# Patient Record
Sex: Female | Born: 1980 | Race: White | Hispanic: No | Marital: Married | Smoking: Former smoker
Health system: Southern US, Community
[De-identification: ages and names within clinical notes are randomized; demographics above are authoritative.]

## PROBLEM LIST (undated history)

## (undated) ENCOUNTER — Inpatient Hospital Stay (HOSPITAL_COMMUNITY): Payer: Self-pay

## (undated) DIAGNOSIS — IMO0002 Reserved for concepts with insufficient information to code with codable children: Secondary | ICD-10-CM

## (undated) DIAGNOSIS — K219 Gastro-esophageal reflux disease without esophagitis: Secondary | ICD-10-CM

## (undated) DIAGNOSIS — Z87898 Personal history of other specified conditions: Secondary | ICD-10-CM

## (undated) DIAGNOSIS — D069 Carcinoma in situ of cervix, unspecified: Secondary | ICD-10-CM

## (undated) DIAGNOSIS — B009 Herpesviral infection, unspecified: Secondary | ICD-10-CM

## (undated) DIAGNOSIS — Z8619 Personal history of other infectious and parasitic diseases: Secondary | ICD-10-CM

## (undated) DIAGNOSIS — E282 Polycystic ovarian syndrome: Secondary | ICD-10-CM

## (undated) DIAGNOSIS — N871 Moderate cervical dysplasia: Secondary | ICD-10-CM

## (undated) DIAGNOSIS — Z872 Personal history of diseases of the skin and subcutaneous tissue: Secondary | ICD-10-CM

## (undated) DIAGNOSIS — R51 Headache: Secondary | ICD-10-CM

## (undated) DIAGNOSIS — N926 Irregular menstruation, unspecified: Secondary | ICD-10-CM

## (undated) DIAGNOSIS — I10 Essential (primary) hypertension: Secondary | ICD-10-CM

## (undated) DIAGNOSIS — B379 Candidiasis, unspecified: Secondary | ICD-10-CM

## (undated) DIAGNOSIS — J45909 Unspecified asthma, uncomplicated: Secondary | ICD-10-CM

## (undated) DIAGNOSIS — Z87440 Personal history of urinary (tract) infections: Secondary | ICD-10-CM

## (undated) DIAGNOSIS — R87619 Unspecified abnormal cytological findings in specimens from cervix uteri: Secondary | ICD-10-CM

## (undated) HISTORY — DX: Personal history of urinary (tract) infections: Z87.440

## (undated) HISTORY — DX: Candidiasis, unspecified: B37.9

## (undated) HISTORY — DX: Polycystic ovarian syndrome: E28.2

## (undated) HISTORY — DX: Reserved for concepts with insufficient information to code with codable children: IMO0002

## (undated) HISTORY — DX: Irregular menstruation, unspecified: N92.6

## (undated) HISTORY — DX: Carcinoma in situ of cervix, unspecified: D06.9

## (undated) HISTORY — PX: NO PAST SURGERIES: SHX2092

## (undated) HISTORY — DX: Unspecified asthma, uncomplicated: J45.909

## (undated) HISTORY — DX: Personal history of other specified conditions: Z87.898

## (undated) HISTORY — DX: Personal history of other infectious and parasitic diseases: Z86.19

## (undated) HISTORY — DX: Moderate cervical dysplasia: N87.1

## (undated) HISTORY — DX: Personal history of diseases of the skin and subcutaneous tissue: Z87.2

## (undated) HISTORY — DX: Herpesviral infection, unspecified: B00.9

## (undated) HISTORY — DX: Unspecified abnormal cytological findings in specimens from cervix uteri: R87.619

## (undated) HISTORY — DX: Headache: R51

---

## 1998-09-15 ENCOUNTER — Other Ambulatory Visit: Admission: RE | Admit: 1998-09-15 | Discharge: 1998-09-15 | Payer: Self-pay | Admitting: Obstetrics and Gynecology

## 1999-11-10 ENCOUNTER — Other Ambulatory Visit: Admission: RE | Admit: 1999-11-10 | Discharge: 1999-11-10 | Payer: Self-pay | Admitting: *Deleted

## 2000-09-16 ENCOUNTER — Emergency Department (HOSPITAL_COMMUNITY): Admission: EM | Admit: 2000-09-16 | Discharge: 2000-09-16 | Payer: Self-pay | Admitting: Emergency Medicine

## 2001-05-29 ENCOUNTER — Emergency Department (HOSPITAL_COMMUNITY): Admission: EM | Admit: 2001-05-29 | Discharge: 2001-05-30 | Payer: Self-pay | Admitting: Internal Medicine

## 2002-04-29 DIAGNOSIS — B009 Herpesviral infection, unspecified: Secondary | ICD-10-CM

## 2002-04-29 HISTORY — DX: Herpesviral infection, unspecified: B00.9

## 2002-11-25 ENCOUNTER — Other Ambulatory Visit: Admission: RE | Admit: 2002-11-25 | Discharge: 2002-11-25 | Payer: Self-pay | Admitting: Obstetrics and Gynecology

## 2002-12-30 ENCOUNTER — Other Ambulatory Visit: Admission: RE | Admit: 2002-12-30 | Discharge: 2002-12-30 | Payer: Self-pay | Admitting: Obstetrics and Gynecology

## 2003-01-14 DIAGNOSIS — D069 Carcinoma in situ of cervix, unspecified: Secondary | ICD-10-CM

## 2003-01-14 DIAGNOSIS — N871 Moderate cervical dysplasia: Secondary | ICD-10-CM

## 2003-01-14 HISTORY — DX: Moderate cervical dysplasia: N87.1

## 2003-01-14 HISTORY — DX: Carcinoma in situ of cervix, unspecified: D06.9

## 2003-09-28 ENCOUNTER — Other Ambulatory Visit: Admission: RE | Admit: 2003-09-28 | Discharge: 2003-09-28 | Payer: Self-pay | Admitting: Obstetrics and Gynecology

## 2005-11-09 ENCOUNTER — Other Ambulatory Visit: Admission: RE | Admit: 2005-11-09 | Discharge: 2005-11-09 | Payer: Self-pay | Admitting: Obstetrics and Gynecology

## 2008-09-29 ENCOUNTER — Emergency Department (HOSPITAL_COMMUNITY): Admission: EM | Admit: 2008-09-29 | Discharge: 2008-09-29 | Payer: Self-pay | Admitting: Emergency Medicine

## 2012-01-30 ENCOUNTER — Encounter: Payer: Self-pay | Admitting: Obstetrics and Gynecology

## 2012-01-30 ENCOUNTER — Ambulatory Visit (INDEPENDENT_AMBULATORY_CARE_PROVIDER_SITE_OTHER): Payer: 59 | Admitting: Obstetrics and Gynecology

## 2012-01-30 VITALS — BP 102/64 | HR 68 | Resp 16 | Ht 60.0 in | Wt 160.0 lb

## 2012-01-30 DIAGNOSIS — Z139 Encounter for screening, unspecified: Secondary | ICD-10-CM

## 2012-01-30 DIAGNOSIS — N926 Irregular menstruation, unspecified: Secondary | ICD-10-CM

## 2012-01-30 DIAGNOSIS — Z01419 Encounter for gynecological examination (general) (routine) without abnormal findings: Secondary | ICD-10-CM

## 2012-01-30 DIAGNOSIS — Z124 Encounter for screening for malignant neoplasm of cervix: Secondary | ICD-10-CM

## 2012-01-30 DIAGNOSIS — N979 Female infertility, unspecified: Secondary | ICD-10-CM | POA: Insufficient documentation

## 2012-01-30 NOTE — Progress Notes (Signed)
Contraception none Last pap 11/09/2010 wnl Last Mammo none Last Colonoscopy none Last Dexa Scan none Primary MD Tammy Spear Abuse at Home none  Wants to conceive.  Has been having unprotected IC x 4yrs and no conception.  Also c/o lower abd discomfort last couple mths.  Reports irregular bleeding for awhile now.  Filed Vitals:   01/30/12 1455  BP: 102/64  Pulse: 68  Resp: 16   ROS: noncontributory  Physical Examination: General appearance - alert, well appearing, and in no distress Neck - supple, no significant adenopathy Chest - clear to auscultation, no wheezes, rales or rhonchi, symmetric air entry Heart - normal rate and regular rhythm Abdomen - soft, nontender, nondistended, no masses or organomegaly Breasts - breasts appear normal, no suspicious masses, no skin or nipple changes or axillary nodes Pelvic - normal external genitalia, vulva, vagina, cervix, uterus and adnexa, small amt of blood Back exam - no CVAT Extremities - no edema, redness or tenderness in the calves or thighs  A/P  RTO 4wks to f/u fertility Labs today Pap today Future labs d21 prog and fasting glucose

## 2012-01-31 LAB — TESTOSTERONE, FREE, TOTAL, SHBG
Testosterone, Free: 18.4 pg/mL — ABNORMAL HIGH (ref 0.6–6.8)
Testosterone-% Free: 1.9 % (ref 0.4–2.4)
Testosterone: 95.24 ng/dL — ABNORMAL HIGH (ref 10–70)

## 2012-01-31 LAB — CBC
HCT: 44 % (ref 36.0–46.0)
Hemoglobin: 14.9 g/dL (ref 12.0–15.0)
MCH: 30.8 pg (ref 26.0–34.0)
RBC: 4.83 MIL/uL (ref 3.87–5.11)

## 2012-01-31 LAB — PAP IG W/ RFLX HPV ASCU

## 2012-01-31 LAB — PROLACTIN: Prolactin: 10.9 ng/mL

## 2012-01-31 LAB — FOLLICLE STIMULATING HORMONE: FSH: 5.8 m[IU]/mL

## 2012-02-13 ENCOUNTER — Telehealth: Payer: Self-pay

## 2012-02-13 NOTE — Telephone Encounter (Signed)
Left message for pt to return call regarding Vit-d protocol and PCO Syndrome. Elizabeth Adams

## 2012-02-20 ENCOUNTER — Telehealth: Payer: Self-pay

## 2012-02-20 NOTE — Telephone Encounter (Signed)
LM for pt to cb re: lab results. Elizabeth Adams A

## 2012-02-26 ENCOUNTER — Telehealth: Payer: Self-pay

## 2012-02-26 NOTE — Telephone Encounter (Signed)
Spoke to pt to let her know that labs suggest PCOS. Pt has follow up appt on 03/03/2012 w/ AR. Will discuss at length then. Pt needs Vit D protocol. Vit D protocol called to [facility] in [place]. Vit D  50,000 units 1x weekly x 12 weeks # 20 No RF. Melody Comas A

## 2012-02-28 ENCOUNTER — Other Ambulatory Visit: Payer: Self-pay

## 2012-02-28 DIAGNOSIS — N979 Female infertility, unspecified: Secondary | ICD-10-CM

## 2012-02-29 ENCOUNTER — Other Ambulatory Visit: Payer: 59

## 2012-02-29 DIAGNOSIS — N979 Female infertility, unspecified: Secondary | ICD-10-CM

## 2012-02-29 LAB — GLUCOSE, FASTING: Glucose, Fasting: 90 mg/dL (ref 70–99)

## 2012-03-03 ENCOUNTER — Ambulatory Visit (INDEPENDENT_AMBULATORY_CARE_PROVIDER_SITE_OTHER): Payer: 59 | Admitting: Obstetrics and Gynecology

## 2012-03-03 ENCOUNTER — Encounter: Payer: Self-pay | Admitting: Obstetrics and Gynecology

## 2012-03-03 VITALS — BP 102/68 | Resp 18 | Ht 60.0 in | Wt 160.0 lb

## 2012-03-03 DIAGNOSIS — N979 Female infertility, unspecified: Secondary | ICD-10-CM

## 2012-03-03 DIAGNOSIS — N926 Irregular menstruation, unspecified: Secondary | ICD-10-CM

## 2012-03-03 MED ORDER — METFORMIN HCL 500 MG PO TABS: 500.0000 mg | ORAL_TABLET | Freq: Every day | ORAL | Status: DC

## 2012-03-03 NOTE — Progress Notes (Signed)
Reviewed labs.  Pt wants to conceive. Labs c/w PCOs and vit D insufficiency Day 21 prog scheduled for Friday RTO 10-12 days for f/u day 21 prog If low, rec femara (or clomid) trial x of ovulation Will also start metformin SE and use reviewed If no success with Femara, rec SA and HSG with femara and IUI and HCG Pt is agreeable

## 2012-03-07 ENCOUNTER — Other Ambulatory Visit: Payer: 59

## 2012-03-07 DIAGNOSIS — N979 Female infertility, unspecified: Secondary | ICD-10-CM

## 2012-03-10 ENCOUNTER — Telehealth: Payer: Self-pay

## 2012-03-10 NOTE — Telephone Encounter (Signed)
Lm for patient to return call regarding follow-up appointment for d21 per Dr. Su Hilt.Mathis Bud

## 2012-03-11 NOTE — Telephone Encounter (Signed)
Pam/return call

## 2012-03-24 ENCOUNTER — Telehealth: Payer: Self-pay | Admitting: Obstetrics and Gynecology

## 2012-03-25 ENCOUNTER — Encounter: Payer: 59 | Admitting: Obstetrics and Gynecology

## 2012-03-28 ENCOUNTER — Telehealth: Payer: Self-pay

## 2012-03-28 NOTE — Telephone Encounter (Signed)
Spoke again to pt to let her know that it would be a good idea to discontinue for a few days if she plans to have some alcoholic beverages, but she should be fine to continue if she doesn't over do it. Pt voices understanding. Melody Comas A

## 2012-03-28 NOTE — Telephone Encounter (Signed)
Per AR, Femara 2.5 mg 1 po q d  Days 3-7 of menses. 0 RF called to pts pharmacy in [place]. Alvino Chapel

## 2012-03-28 NOTE — Telephone Encounter (Signed)
Returned pt's call re 21 Day progesterone test. Femara 2.5 will be called to pt's pharmacy. She will start it next cycle. She also had a ? Re her Metformin and etoh. She is going on vacation in 1-2 weeks and wants to know if she should keep taking Metformin if she plans to have some drinks or should she continue to take and just not drink in excess?

## 2012-05-21 ENCOUNTER — Telehealth: Payer: Self-pay | Admitting: Obstetrics and Gynecology

## 2012-05-21 NOTE — Telephone Encounter (Signed)
Per AR, needs to see pt first.  Last seen in July.   Pt sch for 06-02-12. Pt agreeable.  ld

## 2012-05-21 NOTE — Telephone Encounter (Signed)
VM from pt. Requesting Rx for Clomid. Was to be called for next cycle which is due this week. [facility], [place]. PT [phone] or [phone]

## 2012-06-02 ENCOUNTER — Ambulatory Visit (INDEPENDENT_AMBULATORY_CARE_PROVIDER_SITE_OTHER): Payer: 59 | Admitting: Obstetrics and Gynecology

## 2012-06-02 ENCOUNTER — Encounter: Payer: Self-pay | Admitting: Obstetrics and Gynecology

## 2012-06-02 VITALS — BP 110/70 | Ht 60.0 in | Wt 163.0 lb

## 2012-06-02 DIAGNOSIS — N970 Female infertility associated with anovulation: Secondary | ICD-10-CM

## 2012-06-02 MED ORDER — LETROZOLE 2.5 MG PO TABS: 2.5000 mg | ORAL_TABLET | Freq: Every day | ORAL | Status: DC

## 2012-06-02 NOTE — Progress Notes (Signed)
Here to f/u progesterone for fertility  Filed Vitals:   06/02/12 1548  BP: 110/70   A/P Reviewed plan Pt is anovulatory although cycles have regulated on metformin tid Rx Femara

## 2012-07-16 ENCOUNTER — Other Ambulatory Visit: Payer: 59

## 2012-07-16 DIAGNOSIS — N970 Female infertility associated with anovulation: Secondary | ICD-10-CM

## 2012-07-16 LAB — PROGESTERONE: Progesterone: 5.4 ng/mL

## 2012-07-25 ENCOUNTER — Telehealth: Payer: Self-pay

## 2012-07-25 NOTE — Telephone Encounter (Signed)
LM for pt to cb re: making f/u appt. And to call re recent test results. Elizabeth Adams A

## 2012-07-25 NOTE — Telephone Encounter (Signed)
Spoke to pt to get her scheduled for follow up labs/infertility. Elizabeth Adams A

## 2012-08-06 ENCOUNTER — Ambulatory Visit (INDEPENDENT_AMBULATORY_CARE_PROVIDER_SITE_OTHER): Payer: 59 | Admitting: Obstetrics and Gynecology

## 2012-08-06 ENCOUNTER — Encounter: Payer: Self-pay | Admitting: Obstetrics and Gynecology

## 2012-08-06 VITALS — BP 104/64 | Ht 60.0 in | Wt 164.0 lb

## 2012-08-06 DIAGNOSIS — N970 Female infertility associated with anovulation: Secondary | ICD-10-CM

## 2012-08-06 MED ORDER — LETROZOLE 2.5 MG PO TABS: 5.0000 mg | ORAL_TABLET | Freq: Every day | ORAL | Status: DC

## 2012-08-06 NOTE — Progress Notes (Signed)
Here to f/u femara at 2.5mg  taken during Nov cycle. Pt was anovulatory on 2.5mg  and she cont to take metformin tid  Filed Vitals:   08/06/12 1122  BP: 104/64   [employer] femara to 5mg  Will try again Jan cycle and return for visit after gets blood drawn on day 21 Pt will call to schedule

## 2012-10-07 ENCOUNTER — Telehealth: Payer: Self-pay | Admitting: Obstetrics and Gynecology

## 2012-10-07 NOTE — Telephone Encounter (Signed)
Ar pt 

## 2012-10-08 ENCOUNTER — Telehealth: Payer: Self-pay

## 2012-10-08 ENCOUNTER — Other Ambulatory Visit: Payer: Self-pay | Admitting: Obstetrics and Gynecology

## 2012-10-08 NOTE — Telephone Encounter (Signed)
Spoke to [employer] associate  To RF pt's Metformin 500 mg # 90 to use as previously directed w 1 RF. Per AR/ Alvino Chapel

## 2012-10-08 NOTE — Telephone Encounter (Signed)
Medication RF'd . Enough to last her til 01/2013 AEX. Melody Comas A

## 2012-10-27 ENCOUNTER — Telehealth: Payer: Self-pay | Admitting: Obstetrics and Gynecology

## 2012-10-27 NOTE — Telephone Encounter (Signed)
TC to pt. States LMP 10/12/12.   21 Day Progesterone due 11/02/11.  Pt requests lab be done at [facility]. Order to be faxed. Pt scheduled for F?u with DR Ar 11/14/12.

## 2012-11-01 ENCOUNTER — Other Ambulatory Visit: Payer: Self-pay | Admitting: Obstetrics and Gynecology

## 2013-01-29 LAB — OB RESULTS CONSOLE GBS: GBS: POSITIVE

## 2013-07-10 LAB — OB RESULTS CONSOLE GC/CHLAMYDIA
Chlamydia: NEGATIVE
Gonorrhea: NEGATIVE

## 2013-07-10 LAB — OB RESULTS CONSOLE HEPATITIS B SURFACE ANTIGEN: HEP B S AG: NEGATIVE

## 2013-07-10 LAB — OB RESULTS CONSOLE RUBELLA ANTIBODY, IGM: Rubella: IMMUNE

## 2013-07-10 LAB — OB RESULTS CONSOLE RPR: RPR: NONREACTIVE

## 2013-07-10 LAB — OB RESULTS CONSOLE HIV ANTIBODY (ROUTINE TESTING): HIV: NONREACTIVE

## 2013-08-10 ENCOUNTER — Inpatient Hospital Stay (HOSPITAL_COMMUNITY)
Admission: AD | Admit: 2013-08-10 | Discharge: 2013-08-10 | Disposition: A | Discharge status: Home / Self Care | Payer: 59 | Source: Ambulatory Visit | Attending: Obstetrics and Gynecology | Admitting: Obstetrics and Gynecology

## 2013-08-10 ENCOUNTER — Encounter (HOSPITAL_COMMUNITY): Payer: Self-pay | Admitting: *Deleted

## 2013-08-10 DIAGNOSIS — O4700 False labor before 37 completed weeks of gestation, unspecified trimester: Secondary | ICD-10-CM | POA: Insufficient documentation

## 2013-08-10 DIAGNOSIS — O30009 Twin pregnancy, unspecified number of placenta and unspecified number of amniotic sacs, unspecified trimester: Secondary | ICD-10-CM | POA: Insufficient documentation

## 2013-08-10 LAB — URINALYSIS, ROUTINE W REFLEX MICROSCOPIC
Bilirubin Urine: NEGATIVE
Hgb urine dipstick: NEGATIVE
Specific Gravity, Urine: <1.005 — ABNORMAL LOW (ref 1.005–1.030)
pH: 7 (ref 5.0–8.0)

## 2013-08-10 LAB — WET PREP, GENITAL
Clue Cells Wet Prep HPF POC: NONE SEEN
Trich, Wet Prep: NONE SEEN
Yeast Wet Prep HPF POC: NONE SEEN

## 2013-08-10 LAB — URINE MICROSCOPIC-ADD ON

## 2013-08-10 NOTE — MAU Note (Signed)
Pt presents with complaints of having some abdominal cramping over the last couple of weeks but has gotten worse over the weekend and noticed sharp pains at the lower part of her abdomen. Denies any bleeding but has noticed some yellowish discharge over the last couple of days.

## 2013-08-10 NOTE — MAU Provider Note (Signed)
DATE: 08/10/2013  Maternity Admissions Unit History and Physical Exam for an Obstetrics Patient  Elizabeth Adams is a 32 y.o. female, G1P0, at [redacted]w[redacted]d gestation, who presents for contractions and a yellow vaginal discharge. She has been followed at the [facility].  Her pregnancy has been complicated by twin gestation. The patient denies vaginal odor. She denies leakage of fluid and bleeding. She denies headaches, blurred vision, and right upper quadrant tenderness. See history below.  OB History   Grav Para Term Preterm Abortions TAB SAB Ect Mult Living   1               Past Medical History  Diagnosis Date  . PCOS (polycystic ovarian syndrome)   . H/O varicella   . Asthma   . Headache(784.0)     Frequent  . H/O bacterial infection   . Yeast infection   . 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 Menses, irregular   . Hx: UTI (urinary tract infection)   . CIN II (cervical intraepithelial neoplasia II) 01/14/03  . CIN III (cervical intraepithelial neoplasia grade III) with severe dysplasia 01/14/03  . Abnormal Pap smear 2000, 2004 & 2005  . H/O female hirsutism      History reviewed. No pertinent past surgical history.  Allergies  Allergen Reactions  . Doxycycline Shortness Of Breath    Family History: family history includes Hypertension in her father; Thyroid disease in her mother.  Social History:  reports that she quit smoking about 18 months ago. Her smoking use included Cigarettes. She smoked 0.00 packs per day. She does not have any smokeless tobacco history on file. She reports that she drinks alcohol. She reports that she does not use illicit drugs.  Review of systems: Normal pregnancy complaints.  Admission Physical Exam:  Dilation: Fingertip Effacement (%): 30 Exam by:: Vickie Ponds MD Body mass index is 39.61 kg/(m^2).  Blood pressure 144/86, pulse 92, temperature 98.1 F (36.7 C), temperature source  Oral, resp. rate 16, height 5' (1.524 m), weight 202 lb 12.8 oz (91.989 kg), last menstrual period 12/16/2012.  HEENT:                 Within normal limits Chest:                   Clear Heart:                    Regular rate and rhythm Abdomen:             Gravid and nontender Extremities:          Grossly normal. 2+ edema. Neurologic exam: Grossly normal. No clonus. Pelvic exam:         Cervix: ftp/20%/-4   NST: Category 1 x 2; Contractions: Irritability .   Results for orders placed during the hospital encounter of 08/10/13 (from the past 24 hour(s))  URINALYSIS, ROUTINE W REFLEX MICROSCOPIC     Status: Abnormal   Collection Time    08/10/13 11:54 AM      Result Value Range   Color, Urine YELLOW  YELLOW   APPearance HAZY (*) CLEAR   Specific Gravity, Urine <1.005 (*) 1.005 - 1.030   pH 7.0  5.0 - 8.0   Glucose, UA NEGATIVE  NEGATIVE mg/dL   Hgb urine dipstick NEGATIVE  NEGATIVE   Bilirubin Urine NEGATIVE  NEGATIVE   Ketones, ur NEGATIVE  NEGATIVE mg/dL   Protein, ur  NEGATIVE  NEGATIVE mg/dL   Urobilinogen, UA 0.2  0.0 - 1.0 mg/dL   Nitrite NEGATIVE  NEGATIVE   Leukocytes, UA MODERATE (*) NEGATIVE  URINE MICROSCOPIC-ADD ON     Status: Abnormal   Collection Time    08/10/13 11:54 AM      Result Value Range   Squamous Epithelial / LPF FEW (*) RARE   WBC, UA 0-2  <3 WBC/hpf   Bacteria, UA FEW (*) RARE     Assessment:  [redacted]w[redacted]d gestation  Twin gestation  Uterine irritability  Vaginal discharge  No labor  Plan:  Wet prep was sent. I will call the patient with the results.  PIH discussed.  Return to office in one week for repeat examination.   Melesa Lecy V 08/10/2013, 1:08 PM

## 2013-09-02 ENCOUNTER — Encounter (HOSPITAL_COMMUNITY): Payer: Self-pay | Admitting: *Deleted

## 2013-09-02 ENCOUNTER — Inpatient Hospital Stay (HOSPITAL_COMMUNITY)
Admission: AD | Admit: 2013-09-02 | Discharge: 2013-09-06 | DRG: 765 | Disposition: A | Discharge status: Home / Self Care | Payer: 59 | Source: Ambulatory Visit | Attending: Obstetrics and Gynecology | Admitting: Obstetrics and Gynecology

## 2013-09-02 DIAGNOSIS — O139 Gestational [pregnancy-induced] hypertension without significant proteinuria, unspecified trimester: Principal | ICD-10-CM | POA: Diagnosis present

## 2013-09-02 DIAGNOSIS — O99892 Other specified diseases and conditions complicating childbirth: Secondary | ICD-10-CM | POA: Diagnosis present

## 2013-09-02 DIAGNOSIS — O9989 Other specified diseases and conditions complicating pregnancy, childbirth and the puerperium: Secondary | ICD-10-CM

## 2013-09-02 DIAGNOSIS — O30009 Twin pregnancy, unspecified number of placenta and unspecified number of amniotic sacs, unspecified trimester: Secondary | ICD-10-CM | POA: Diagnosis present

## 2013-09-02 DIAGNOSIS — O30049 Twin pregnancy, dichorionic/diamniotic, unspecified trimester: Secondary | ICD-10-CM

## 2013-09-02 DIAGNOSIS — Z22330 Carrier of Group B streptococcus: Secondary | ICD-10-CM

## 2013-09-02 NOTE — Progress Notes (Signed)
Non-pitting edema noted bilaterally in lower extremeties

## 2013-09-02 NOTE — MAU Note (Signed)
Pt seen in the office today 3/100%, now having stronger ctx. Twin pregnancy, GBS +

## 2013-09-03 ENCOUNTER — Encounter (HOSPITAL_COMMUNITY): Payer: Self-pay | Admitting: Anesthesiology

## 2013-09-03 ENCOUNTER — Encounter (HOSPITAL_COMMUNITY): Payer: 59 | Admitting: Anesthesiology

## 2013-09-03 ENCOUNTER — Inpatient Hospital Stay (HOSPITAL_COMMUNITY): Payer: 59

## 2013-09-03 ENCOUNTER — Encounter (HOSPITAL_COMMUNITY)
Admission: AD | Disposition: A | Discharge status: Home / Self Care | Payer: Self-pay | Source: Ambulatory Visit | Attending: Obstetrics and Gynecology

## 2013-09-03 ENCOUNTER — Inpatient Hospital Stay (HOSPITAL_COMMUNITY): Payer: 59 | Admitting: Anesthesiology

## 2013-09-03 ENCOUNTER — Encounter (HOSPITAL_COMMUNITY): Payer: Self-pay | Admitting: Family Medicine

## 2013-09-03 DIAGNOSIS — O139 Gestational [pregnancy-induced] hypertension without significant proteinuria, unspecified trimester: Secondary | ICD-10-CM | POA: Diagnosis present

## 2013-09-03 DIAGNOSIS — O479 False labor, unspecified: Secondary | ICD-10-CM

## 2013-09-03 DIAGNOSIS — O30049 Twin pregnancy, dichorionic/diamniotic, unspecified trimester: Secondary | ICD-10-CM | POA: Diagnosis present

## 2013-09-03 LAB — COMPREHENSIVE METABOLIC PANEL
ALBUMIN: 2.6 g/dL — AB (ref 3.5–5.2)
ALK PHOS: 245 U/L — AB (ref 39–117)
ALT: 11 U/L (ref 0–35)
AST: 33 U/L (ref 0–37)
BILIRUBIN TOTAL: 0.2 mg/dL — AB (ref 0.3–1.2)
BUN: 9 mg/dL (ref 6–23)
CHLORIDE: 102 meq/L (ref 96–112)
CO2: 22 mEq/L (ref 19–32)
Calcium: 10.4 mg/dL (ref 8.4–10.5)
Creatinine, Ser: 0.93 mg/dL (ref 0.50–1.10)
GFR calc non Af Amer: 79 mL/min — ABNORMAL LOW (ref >90)
GLUCOSE: 75 mg/dL (ref 70–99)
POTASSIUM: 3.8 meq/L (ref 3.7–5.3)
SODIUM: 138 meq/L (ref 137–147)
TOTAL PROTEIN: 5.9 g/dL — AB (ref 6.0–8.3)

## 2013-09-03 LAB — CBC
HCT: 39.8 % (ref 36.0–46.0)
HCT: 37 % (ref 36.0–46.0)
HEMATOCRIT: 39.9 % (ref 36.0–46.0)
HEMOGLOBIN: 12.7 g/dL (ref 12.0–15.0)
HEMOGLOBIN: 13.8 g/dL (ref 12.0–15.0)
Hemoglobin: 13.5 g/dL (ref 12.0–15.0)
MCH: 30.7 pg (ref 26.0–34.0)
MCH: 31 pg (ref 26.0–34.0)
MCH: 31.2 pg (ref 26.0–34.0)
MCHC: 33.9 g/dL (ref 30.0–36.0)
MCHC: 34.3 g/dL (ref 30.0–36.0)
MCHC: 34.6 g/dL (ref 30.0–36.0)
MCV: 90.1 fL (ref 78.0–100.0)
MCV: 90.2 fL (ref 78.0–100.0)
MCV: 90.5 fL (ref 78.0–100.0)
PLATELETS: 220 10*3/uL (ref 150–400)
Platelets: 194 10*3/uL (ref 150–400)
Platelets: 219 10*3/uL (ref 150–400)
RBC: 4.1 MIL/uL (ref 3.87–5.11)
RBC: 4.4 MIL/uL (ref 3.87–5.11)
RBC: 4.43 MIL/uL (ref 3.87–5.11)
RDW: 14.3 % (ref 11.5–15.5)
RDW: 14.6 % (ref 11.5–15.5)
RDW: 14.9 % (ref 11.5–15.5)
WBC: 13.1 10*3/uL — ABNORMAL HIGH (ref 4.0–10.5)
WBC: 11.4 10*3/uL — ABNORMAL HIGH (ref 4.0–10.5)
WBC: 12.3 10*3/uL — AB (ref 4.0–10.5)

## 2013-09-03 LAB — PROTEIN / CREATININE RATIO, URINE
CREATININE, URINE: 55.11 mg/dL
Protein Creatinine Ratio: 0.26 — ABNORMAL HIGH (ref 0.00–0.15)
Total Protein, Urine: 14.3 mg/dL

## 2013-09-03 LAB — URINALYSIS, ROUTINE W REFLEX MICROSCOPIC
Bilirubin Urine: NEGATIVE
GLUCOSE, UA: NEGATIVE mg/dL
Ketones, ur: NEGATIVE mg/dL
NITRITE: NEGATIVE
PH: 6.5 (ref 5.0–8.0)
Protein, ur: NEGATIVE mg/dL
Urobilinogen, UA: 0.2 mg/dL (ref 0.0–1.0)

## 2013-09-03 LAB — ABO/RH: ABO/RH(D): B POS

## 2013-09-03 LAB — RPR: RPR Ser Ql: NONREACTIVE

## 2013-09-03 LAB — URINE MICROSCOPIC-ADD ON

## 2013-09-03 LAB — PREPARE RBC (CROSSMATCH)

## 2013-09-03 SURGERY — Surgical Case
Anesthesia: Spinal | Site: Abdomen

## 2013-09-03 MED ORDER — MORPHINE SULFATE (PF) 0.5 MG/ML IJ SOLN
INTRAMUSCULAR | Status: DC | PRN
  Administered 2013-09-03: .1 mg via INTRATHECAL

## 2013-09-03 MED ORDER — EPHEDRINE 5 MG/ML INJ: 10.0000 mg | INTRAVENOUS | Status: DC | PRN

## 2013-09-03 MED ORDER — FENTANYL CITRATE 0.05 MG/ML IJ SOLN
INTRAMUSCULAR | Status: DC | PRN
  Administered 2013-09-03: 15 ug via INTRATHECAL

## 2013-09-03 MED ORDER — OXYCODONE-ACETAMINOPHEN 5-325 MG PO TABS: 1.0000 | ORAL_TABLET | ORAL | Status: DC | PRN

## 2013-09-03 MED ORDER — PHENYLEPHRINE 40 MCG/ML (10ML) SYRINGE FOR IV PUSH (FOR BLOOD PRESSURE SUPPORT): 80.0000 ug | PREFILLED_SYRINGE | INTRAVENOUS | Status: DC | PRN

## 2013-09-03 MED ORDER — MEPERIDINE HCL 25 MG/ML IJ SOLN
INTRAMUSCULAR | Status: AC
  Filled 2013-09-03: qty 1

## 2013-09-03 MED ORDER — FENTANYL CITRATE 0.05 MG/ML IJ SOLN
INTRAMUSCULAR | Status: DC | PRN
  Administered 2013-09-03: 87.5 ug via INTRAVENOUS

## 2013-09-03 MED ORDER — KETOROLAC TROMETHAMINE 60 MG/2ML IM SOLN
INTRAMUSCULAR | Status: AC
  Filled 2013-09-03: qty 2

## 2013-09-03 MED ORDER — OXYTOCIN BOLUS FROM INFUSION: 500.0000 mL | INTRAVENOUS | Status: DC

## 2013-09-03 MED ORDER — CEFAZOLIN SODIUM-DEXTROSE 2-3 GM-% IV SOLR
2.0000 g | Freq: Once | INTRAVENOUS | Status: DC
  Filled 2013-09-03: qty 50

## 2013-09-03 MED ORDER — IBUPROFEN 600 MG PO TABS: 600.0000 mg | ORAL_TABLET | Freq: Four times a day (QID) | ORAL | Status: DC | PRN

## 2013-09-03 MED ORDER — BUPIVACAINE IN DEXTROSE 0.75-8.25 % IT SOLN
INTRATHECAL | Status: DC | PRN
  Administered 2013-09-03: 1.2 mg via INTRATHECAL

## 2013-09-03 MED ORDER — FENTANYL CITRATE 0.05 MG/ML IJ SOLN
INTRAMUSCULAR | Status: AC
  Filled 2013-09-03: qty 2

## 2013-09-03 MED ORDER — FAMOTIDINE 20 MG PO TABS
10.0000 mg | ORAL_TABLET | Freq: Two times a day (BID) | ORAL | Status: DC
  Administered 2013-09-03: 10 mg via ORAL
  Filled 2013-09-03: qty 1

## 2013-09-03 MED ORDER — FENTANYL CITRATE 0.05 MG/ML IJ SOLN
25.0000 ug | INTRAMUSCULAR | Status: DC | PRN
  Administered 2013-09-04: 50 ug via INTRAVENOUS

## 2013-09-03 MED ORDER — MEPERIDINE HCL 25 MG/ML IJ SOLN: 6.2500 mg | INTRAMUSCULAR | Status: DC | PRN

## 2013-09-03 MED ORDER — METOCLOPRAMIDE HCL 5 MG/ML IJ SOLN: 10.0000 mg | Freq: Once | INTRAMUSCULAR | Status: AC | PRN

## 2013-09-03 MED ORDER — MORPHINE SULFATE (PF) 0.5 MG/ML IJ SOLN
INTRAMUSCULAR | Status: DC | PRN
  Administered 2013-09-03: 4.9 mg via INTRAVENOUS

## 2013-09-03 MED ORDER — LACTATED RINGERS IV SOLN
INTRAVENOUS | Status: DC | PRN
  Administered 2013-09-03: 22:00:00 via INTRAVENOUS

## 2013-09-03 MED ORDER — PHENYLEPHRINE HCL 10 MG/ML IJ SOLN: INTRAMUSCULAR | Status: DC | PRN

## 2013-09-03 MED ORDER — MORPHINE SULFATE 0.5 MG/ML IJ SOLN
INTRAMUSCULAR | Status: AC
  Filled 2013-09-03: qty 10

## 2013-09-03 MED ORDER — FENTANYL 2.5 MCG/ML BUPIVACAINE 1/10 % EPIDURAL INFUSION (WH - ANES): 14.0000 mL/h | INTRAMUSCULAR | Status: DC | PRN

## 2013-09-03 MED ORDER — OXYTOCIN 40 UNITS IN LACTATED RINGERS INFUSION - SIMPLE MED
1.0000 m[IU]/min | INTRAVENOUS | Status: DC
  Administered 2013-09-03: 2 m[IU]/min via INTRAVENOUS
  Filled 2013-09-03: qty 1000

## 2013-09-03 MED ORDER — KETOROLAC TROMETHAMINE 60 MG/2ML IM SOLN
60.0000 mg | Freq: Once | INTRAMUSCULAR | Status: AC | PRN
  Administered 2013-09-03: 60 mg via INTRAMUSCULAR

## 2013-09-03 MED ORDER — ONDANSETRON HCL 4 MG/2ML IJ SOLN
INTRAMUSCULAR | Status: DC | PRN
  Administered 2013-09-03: 4 mg via INTRAVENOUS

## 2013-09-03 MED ORDER — LACTATED RINGERS IV SOLN: 500.0000 mL | INTRAVENOUS | Status: DC | PRN

## 2013-09-03 MED ORDER — SCOPOLAMINE 1 MG/3DAYS TD PT72
MEDICATED_PATCH | TRANSDERMAL | Status: AC
  Filled 2013-09-03: qty 1

## 2013-09-03 MED ORDER — LACTATED RINGERS IV SOLN
INTRAVENOUS | Status: DC
  Administered 2013-09-03 (×4): via INTRAVENOUS

## 2013-09-03 MED ORDER — DIPHENHYDRAMINE HCL 50 MG/ML IJ SOLN: 12.5000 mg | INTRAMUSCULAR | Status: DC | PRN

## 2013-09-03 MED ORDER — ACETAMINOPHEN 325 MG PO TABS: 650.0000 mg | ORAL_TABLET | ORAL | Status: DC | PRN

## 2013-09-03 MED ORDER — DEXTROSE 5 % IV SOLN
2.5000 10*6.[IU] | INTRAVENOUS | Status: DC
  Administered 2013-09-03 (×3): 2.5 10*6.[IU] via INTRAVENOUS
  Filled 2013-09-03 (×6): qty 2.5

## 2013-09-03 MED ORDER — ONDANSETRON HCL 4 MG/2ML IJ SOLN: 4.0000 mg | Freq: Four times a day (QID) | INTRAMUSCULAR | Status: DC | PRN

## 2013-09-03 MED ORDER — LABETALOL HCL 5 MG/ML IV SOLN
10.0000 mg | INTRAVENOUS | Status: DC | PRN
Start: 2013-09-03 — End: 2013-09-04
  Filled 2013-09-03: qty 8

## 2013-09-03 MED ORDER — VALACYCLOVIR HCL 500 MG PO TABS
500.0000 mg | ORAL_TABLET | Freq: Every day | ORAL | Status: DC
  Filled 2013-09-03 (×2): qty 1

## 2013-09-03 MED ORDER — LIDOCAINE HCL (PF) 1 % IJ SOLN: 30.0000 mL | INTRAMUSCULAR | Status: DC | PRN

## 2013-09-03 MED ORDER — LABETALOL HCL 5 MG/ML IV SOLN
20.0000 mg | Freq: Once | INTRAVENOUS | Status: AC
  Administered 2013-09-03: 20 mg via INTRAVENOUS
  Filled 2013-09-03: qty 4

## 2013-09-03 MED ORDER — SCOPOLAMINE 1 MG/3DAYS TD PT72
1.0000 | MEDICATED_PATCH | Freq: Once | TRANSDERMAL | Status: DC
  Administered 2013-09-03: 1.5 mg via TRANSDERMAL

## 2013-09-03 MED ORDER — PHENYLEPHRINE 8 MG IN D5W 100 ML (0.08MG/ML) PREMIX OPTIME
INJECTION | INTRAVENOUS | Status: DC | PRN
  Administered 2013-09-03: 65 ug/min via INTRAVENOUS

## 2013-09-03 MED ORDER — PHENYLEPHRINE 8 MG IN D5W 100 ML (0.08MG/ML) PREMIX OPTIME
INJECTION | INTRAVENOUS | Status: AC
  Filled 2013-09-03: qty 100

## 2013-09-03 MED ORDER — LACTATED RINGERS IV SOLN
INTRAVENOUS | Status: DC
  Administered 2013-09-03: 20:00:00 via INTRAVENOUS

## 2013-09-03 MED ORDER — CITRIC ACID-SODIUM CITRATE 334-500 MG/5ML PO SOLN
30.0000 mL | ORAL | Status: DC | PRN
Start: 2013-09-03 — End: 2013-09-03
  Administered 2013-09-03 (×2): 30 mL via ORAL
  Filled 2013-09-03 (×2): qty 15

## 2013-09-03 MED ORDER — ONDANSETRON HCL 4 MG/2ML IJ SOLN
INTRAMUSCULAR | Status: AC
  Filled 2013-09-03: qty 2

## 2013-09-03 MED ORDER — OXYTOCIN 10 UNIT/ML IJ SOLN
INTRAMUSCULAR | Status: AC
  Filled 2013-09-03: qty 4

## 2013-09-03 MED ORDER — CEFAZOLIN SODIUM-DEXTROSE 2-3 GM-% IV SOLR
INTRAVENOUS | Status: DC | PRN
  Administered 2013-09-03: 2 g via INTRAVENOUS

## 2013-09-03 MED ORDER — OXYTOCIN 40 UNITS IN LACTATED RINGERS INFUSION - SIMPLE MED: 62.5000 mL/h | INTRAVENOUS | Status: DC

## 2013-09-03 MED ORDER — OXYTOCIN 10 UNIT/ML IJ SOLN
40.0000 [IU] | INTRAVENOUS | Status: DC | PRN
  Administered 2013-09-03: 40 [IU] via INTRAVENOUS

## 2013-09-03 MED ORDER — DEXTROSE 5 % IV SOLN
5.0000 10*6.[IU] | Freq: Once | INTRAVENOUS | Status: AC
  Administered 2013-09-03: 5 10*6.[IU] via INTRAVENOUS
  Filled 2013-09-03: qty 5

## 2013-09-03 MED ORDER — LACTATED RINGERS IV SOLN
500.0000 mL | Freq: Once | INTRAVENOUS | Status: DC
  Administered 2013-09-03: 500 mL via INTRAVENOUS

## 2013-09-03 MED ORDER — MEPERIDINE HCL 25 MG/ML IJ SOLN
INTRAMUSCULAR | Status: DC | PRN
  Administered 2013-09-03 (×2): 12.5 mg via INTRAVENOUS

## 2013-09-03 SURGICAL SUPPLY — 38 items
APL SKNCLS STERI-STRIP NONHPOA (GAUZE/BANDAGES/DRESSINGS) ×1
BENZOIN TINCTURE PRP APPL 2/3 (GAUZE/BANDAGES/DRESSINGS) ×2 IMPLANT
CLAMP CORD UMBIL (MISCELLANEOUS) IMPLANT
CLOTH BEACON ORANGE TIMEOUT ST (SAFETY) ×2 IMPLANT
CONTAINER PREFILL 10% NBF 15ML (MISCELLANEOUS) IMPLANT
DRAIN JACKSON PRT FLT 10 (DRAIN) IMPLANT
DRAPE LG THREE QUARTER DISP (DRAPES) IMPLANT
DRSG OPSITE POSTOP 4X10 (GAUZE/BANDAGES/DRESSINGS) ×2 IMPLANT
DURAPREP 26ML APPLICATOR (WOUND CARE) ×2 IMPLANT
ELECT REM PT RETURN 9FT ADLT (ELECTROSURGICAL) ×2
ELECTRODE REM PT RTRN 9FT ADLT (ELECTROSURGICAL) ×1 IMPLANT
EVACUATOR SILICONE 100CC (DRAIN) IMPLANT
EXTRACTOR VACUUM M CUP 4 TUBE (SUCTIONS) IMPLANT
GLOVE BIO SURGEON STRL SZ 6.5 (GLOVE) ×2 IMPLANT
GLOVE BIOGEL PI IND STRL 7.0 (GLOVE) ×1 IMPLANT
GLOVE BIOGEL PI INDICATOR 7.0 (GLOVE) ×1
GOWN PREVENTION PLUS XLARGE (GOWN DISPOSABLE) ×2 IMPLANT
GOWN STRL REIN XL XLG (GOWN DISPOSABLE) ×2 IMPLANT
KIT ABG SYR 3ML LUER SLIP (SYRINGE) IMPLANT
NDL HYPO 25X5/8 SAFETYGLIDE (NEEDLE) IMPLANT
NEEDLE HYPO 25X5/8 SAFETYGLIDE (NEEDLE) IMPLANT
NS IRRIG 1000ML POUR BTL (IV SOLUTION) ×2 IMPLANT
PACK C SECTION WH (CUSTOM PROCEDURE TRAY) ×2 IMPLANT
PAD OB MATERNITY 4.3X12.25 (PERSONAL CARE ITEMS) ×2 IMPLANT
RTRCTR C-SECT PINK 25CM LRG (MISCELLANEOUS) IMPLANT
STAPLER VISISTAT 35W (STAPLE) IMPLANT
STRIP CLOSURE SKIN 1/2X4 (GAUZE/BANDAGES/DRESSINGS) ×2 IMPLANT
SUT CHROMIC 0 CT 1 (SUTURE) ×2 IMPLANT
SUT MNCRL AB 3-0 PS2 27 (SUTURE) ×1 IMPLANT
SUT PLAIN 2 0 (SUTURE) ×4
SUT PLAIN 2 0 XLH (SUTURE) ×2 IMPLANT
SUT PLAIN ABS 2-0 CT1 27XMFL (SUTURE) ×2 IMPLANT
SUT SILK 2 0 SH (SUTURE) ×1 IMPLANT
SUT VIC AB 0 CTX 36 (SUTURE) ×8
SUT VIC AB 0 CTX36XBRD ANBCTRL (SUTURE) ×4 IMPLANT
TOWEL OR 17X24 6PK STRL BLUE (TOWEL DISPOSABLE) ×2 IMPLANT
TRAY FOLEY CATH 14FR (SET/KITS/TRAYS/PACK) ×2 IMPLANT
WATER STERILE IRR 1000ML POUR (IV SOLUTION) ×2 IMPLANT

## 2013-09-03 NOTE — Progress Notes (Signed)
Per U/S clarification needed between Dr. Melene MullerWhittaker and Dr. Su Hiltoberts regarding request of U/S of patient.

## 2013-09-03 NOTE — Progress Notes (Signed)
Patient ID: Elizabeth Adams Trillo, female   DOB: 1980-12-10, 33 y.o.   MRN: [id number] Elizabeth Adams Elizabeth Adams is a 33 y.o. G1P0 at 7513w2d admitted for twins, GHTN   Subjective: Now on L&D, pt remains anxious but coping appropriately, occ feeling ctx, denies any HA/N/V/RUQ pain   Objective: BP 141/100  Pulse 100  Temp(Src) 98 F (36.7 C) (Oral)  Resp 18  Ht 5' (1.524 Adams)  Wt 207 lb (93.895 kg)  BMI 40.43 kg/m2  LMP 12/16/2012  Filed Vitals:   09/03/13 0247 09/03/13 0406 09/03/13 0430 09/03/13 0511  BP: 140/85 147/99 141/90 141/100  Pulse: 92 95 89 100  Temp:  98 F (36.7 C)    TempSrc:  Oral    Resp:  18 18 18   Height:  5' (1.524 Adams)    Weight:  207 lb (93.895 kg)         FHT:  Cat 1, x2  UC:   toco irreg 4-6   SVE:   Dilation: 2.5 Effacement (%): 100 Station: -3 Exam by:: S. Brynlynn Walko CNM  SSE no evidence of HSV lesions   Assessment / Plan:  Labor: will start pitocin for IOL now Preeclampsia:  labs normal, including PCR, BP stable Fetal Wellbeing:  Category I x2  Pain Control:  planning epidural  Anticipated MOD:  undetermined  GBS pos, rcv'ing PCN   IV labetalol PRN  Pitocin per protocol Will consider AROM 4hrs after PCN  Epidural when appropriate    Update physician PRN   Elizabeth Adams 09/03/2013, 5:38 AM

## 2013-09-03 NOTE — Op Note (Signed)
Cesarean Section Procedure Note   Elizabeth Adams  09/02/2013 - 09/03/2013  Indications: twins with induction for PIH.  Pt requested a cesarean section   Pre-operative Diagnosis: Pregnancy Induced Hypertension, twins, [redacted] weeks gestation.   Post-operative Diagnosis: Same   Surgeon: Surgeon(s) and Role:    * Michael LitterNaima A Mayci Haning, MD - Primary   Assistants: Almond LintShelly Lillard CNM   Anesthesia: spinal   Procedure Details:  The patient was seen in the Holding Room. The risks, benefits, complications, treatment options, and expected outcomes were discussed with the patient. The patient concurred with the proposed plan, giving informed consent. identified as Elizabeth Adams and the procedure verified as C-Section Delivery. A Time Out was held and the above information confirmed.  After induction of anesthesia, the patient was draped and prepped in the usual sterile manner. A transverse incision was made and carried down through the subcutaneous tissue to the fascia. Fascial incision was made in the midline and extended transversely. The fascia was separated from the underlying rectus muscle superiorly and inferiorly. The peritoneum was identified and entered. Peritoneal incision was extended longitudinally with good visualization of bowel and bladder. The utero-vesical peritoneal reflection was incised transversely and the bladder flap was bluntly freed from the lower uterine segment.  An alexsis retractor was placed in the abdomen.   A low transverse uterine incision was made. Delivered from vtx/vtx presentation were female i  infants, with Apgar scores of 9 at one minute and 9 at five minutes for both infants. Cord ph was not sent the umbilical cord was clamped and cut cord blood was obtained for evaluation. The placenta was removed Intact and appeared normal. The uterine outline, tubes and ovaries appeared normal}. The uterine incision was closed with running locked sutures of 0Vicryl. A second layer 0 vicrlyl was  used to imbricate the uterine incision    Hemostasis was observed. Lavage was carried out until clear. The alexsis was removed.  The peritoneum was closed with 0 chromic.  The muscles were examined and any bleeders were made hemostatic using bovie cautery device.   The fascia was then reapproximated with running sutures of 0 vicryl.  The subcutaneous tissue was reapproximated  With interrupted stitches using 2-0 plain gut. The subcuticular closure was performed using 3-230monocryl     Instrument, sponge, and needle counts were correct prior the abdominal closure and were correct at the conclusion of the case.    Findings: infants were delivered from vtx/vtx presentation. The fluid was clear in both amniotic sacs.  The uterus tubes and ovaries appeared normal.     Estimated Blood Loss: 950ml   Total IV Fluids: 2500ml   Urine Output: 250CC OF clear urine  Specimens: placentas  Complications: no complications  Disposition: PACU - hemodynamically stable.   Maternal Condition: stable   Baby condition / location:  one with mom the other with dad  Attending Attestation: I was present and scrubbed for the entire procedure.   Signed: Surgeon(s): Michael LitterNaima A Rozelia Catapano, MD

## 2013-09-03 NOTE — Progress Notes (Addendum)
Patient ID: Elizabeth Adams, female   DOB: April 16, 1981, 33 y.o.   MRN: [id number] Elizabeth SellsDawn M Shoe is a 33 y.o. G1P0 at 2311w2d admitted for IOL for PIH, di/di twins   Subjective: Overall comfortable, feels some ctx, hasn't needed pain meds  Objective: BP 147/86  Pulse 99  Temp(Src) 98 F (36.7 C) (Oral)  Resp 18  Ht 5' (1.524 m)  Wt 207 lb (93.895 kg)  BMI 40.43 kg/m2  SpO2 99%  LMP 12/16/2012     FHT:  Cat 1 x2  UC:   toco 2-4   SVE:   Dilation: 2 Effacement (%): 100 Station: -3 Exam by:: Sanda KleinShelley Marcell Pfeifer CNM    Assessment / Plan:  Labor: failed IOL, pt now desires C/S  Preeclampsia:  denies PIH sx's, BP's have been stable, more recently elevated, PIH labs were normal at admission  Fetal Wellbeing:  Category I Pain Control:  not needed, declines  Anticipated MOD:  primary c/s  US done about 5pm to r/o previa was WNL and no previa or cord presentation identified   GBS pos, has rcv'd PCN  Gave pt option of 1. Stopping pitocin, allowing to eat, and restart pitocin in the AM or 2. Proceeding w c/s.  Pt would like to proceed to w c/s rv'd r/b including 1. Infection 2. Bleeding, possibly requiring blood tx, 3. Anesthesia complications 4. Damage to internal organs.  Pt understands these risks and wants to proceed  Updated Dr Normand Sloopillard and OR notified, will draw stat CBC, ancef 2g ordered, type and cross match     Lenord Fralix M 09/03/2013, 8:41 PM

## 2013-09-03 NOTE — Transfer of Care (Signed)
Immediate Anesthesia Transfer of Care Note  Patient: Elizabeth Adams  Procedure(s) Performed: Procedure(s): CESAREAN SECTION (N/A)  Patient Location: PACU  Anesthesia Type:Spinal  Level of Consciousness: awake  Airway & Oxygen Therapy: Patient Spontanous Breathing  Post-op Assessment: Report given to PACU RN and Post -op Vital signs reviewed and stable  Post vital signs: stable  Complications: No apparent anesthesia complications

## 2013-09-03 NOTE — Anesthesia Preprocedure Evaluation (Signed)
Anesthesia Evaluation  Patient identified by MRN, date of birth, ID band Patient awake    Reviewed: Allergy & Precautions, H&P , NPO status , Patient's Chart, lab work & pertinent test results, reviewed documented beta blocker date and time   History of Anesthesia Complications Negative for: history of anesthetic complications  Airway Mallampati: III TM Distance: >3 FB Neck ROM: full    Dental  (+) Teeth Intact   Pulmonary asthma , former smoker,  breath sounds clear to auscultation  Pulmonary exam normal       Cardiovascular hypertension (preeclampsia, not on magnesium), Rhythm:regular Rate:Normal     Neuro/Psych  Headaches, negative psych ROS   GI/Hepatic Neg liver ROS, GERD-  Medicated,  Endo/Other  Morbid obesity  Renal/GU negative Renal ROS  Female GU complaint     Musculoskeletal   Abdominal Normal abdominal exam  (+)   Peds  Hematology negative hematology ROS (+)   Anesthesia Other Findings   Reproductive/Obstetrics (+) Pregnancy (twins, preeclampsia)                           Anesthesia Physical  Anesthesia Plan  ASA: III and emergent  Anesthesia Plan: Spinal   Post-op Pain Management:    Induction:   Airway Management Planned:   Additional Equipment:   Intra-op Plan:   Post-operative Plan:   Informed Consent: I have reviewed the patients History and Physical, chart, labs and discussed the procedure including the risks, benefits and alternatives for the proposed anesthesia with the patient or authorized representative who has indicated his/her understanding and acceptance.     Plan Discussed with: Surgeon and CRNA  Anesthesia Plan Comments:         Anesthesia Quick Evaluation

## 2013-09-03 NOTE — Progress Notes (Signed)
Elizabeth Adams is a 33 y.o. G1P0 at 686w2d  Subjective: fairly comfortable on pitocin  Objective: BP 143/92  Pulse 93  Temp(Src) 98.1 F (36.7 C) (Oral)  Resp 18  Ht 5' (1.524 m)  Wt 93.895 kg (207 lb)  BMI 40.43 kg/m2  SpO2 99%  LMP 12/16/2012      FHT:  Cat 1 x 2 UC:   regular, every 3-5 minutes SVE:   Dilation: 2 Effacement (%): 100 Station: -3 Exam by:: Vendela Troung  Labs: Lab Results  Component Value Date   WBC 12.3* 09/02/2013   HGB 13.8 09/02/2013   HCT 39.9 09/02/2013   MCV 90.1 09/02/2013   PLT 219 09/02/2013    Assessment / Plan: Induction of labor due to gestational hypertension and multifetal gestation,  progressing well on pitocin  Labor: Progressing on Pitocin, will continue to increase then AROM Preeclampsia:  no signs or symptoms of toxicity Fetal Wellbeing:  Category I Pain Control:  Labor support without medications I/D:  GBS + on PCN, no s/sxs of toxicity Anticipated MOD:  NSVD  Aslee Such Y 09/03/2013, 11:24 AM

## 2013-09-03 NOTE — H&P (Signed)
Elizabeth Adams is a 33 y.o. female presenting for cramping. Found to have significantly elevated BP's. V.Smith, CNM initially evaluated pt in MAU. PIH labs were done, including PCR which were found to be normal. Pt was given IV labetalol and BP improved. Pt denies any PIH sx's. No cervical change was noted.  After d/w Dr Estanislado Pandyivard, decision was made to admit pt for IOL.   Pregnancy course:  Pt began PNC at [facility] at 10wks GBS + bacteruria tx'd Normal genetic screens Normal anatomy scan, although limited f/u scan WNL F/u growth scans WNL, most recent at 36wks, EFW baby A 6#14oz, baby B 6#11oz    Maternal Medical History:  Reason for admission: Contractions.  Nausea. GHTN  Contractions: Onset was 6-12 hours ago.   Frequency: irregular.   Perceived severity is mild.    Fetal activity: Perceived fetal activity is normal.   Last perceived fetal movement was within the past hour.    Prenatal complications: no prenatal complications Prenatal Complications - Diabetes: none.    OB History   Grav Para Term Preterm Abortions TAB SAB Ect Mult Living   1              Past Medical History  Diagnosis Date  . PCOS (polycystic ovarian syndrome)   . H/O varicella   . Asthma   . Headache(784.0)     Frequent  . H/O bacterial infection   . Yeast infection   . 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 Menses, irregular   . Hx: UTI (urinary tract infection)   . CIN II (cervical intraepithelial neoplasia II) 01/14/03  . CIN III (cervical intraepithelial neoplasia grade III) with severe dysplasia 01/14/03  . Abnormal Pap smear 2000, 2004 & 2005  . H/O female hirsutism    Past Surgical History  Procedure Laterality Date  . No past surgeries     Family History: family history includes Hypertension in her father; Thyroid disease in her mother. Social History:  reports that she quit smoking about 19 months ago. Her smoking use included Cigarettes. She smoked 0.00 packs per day. She does not have any smokeless  tobacco history on file. She reports that she drinks alcohol. She reports that she does not use illicit drugs.   Prenatal Transfer Tool  Maternal Diabetes: No Genetic Screening: Normal Maternal Ultrasounds/Referrals: Normal Fetal Ultrasounds or other Referrals:  None Maternal Substance Abuse:  No Significant Maternal Medications:  None Significant Maternal Lab Results:  Lab values include: Group B Strep positive Other Comments:  Di/DI twins   Review of Systems  Eyes: Negative for blurred vision.  Cardiovascular: Negative for chest pain and leg swelling.  Gastrointestinal: Negative for heartburn, nausea and vomiting.  Neurological: Negative for headaches.  All other systems reviewed and are negative.    Dilation: 1 Effacement (%): 100 Station: -3 Exam by:: [place], CNM Blood pressure 147/99, pulse 95, temperature 98 F (36.7 C), temperature source Oral, resp. rate 18, height 5' (1.524 m), weight 207 lb (93.895 kg), last menstrual period 12/16/2012. Maternal Exam:  Uterine Assessment: Contraction strength is mild.  Contraction frequency is irregular.   Abdomen: Patient reports no abdominal tenderness. Estimated fetal weight is baby A 6#14oz, baby B 6#11oz .   Fetal presentation: vertex  Introitus: Normal vulva. Normal vagina.  Pelvis: adequate for delivery.   Cervix: Cervix evaluated by sterile speculum exam and digital exam.     Fetal Exam Fetal Monitor Review: Mode: ultrasound.    Fetal State Assessment: Category I - tracings are  normal.     Physical Exam  Nursing note and vitals reviewed. Constitutional: She is oriented to person, place, and time. She appears well-developed and well-nourished.  HENT:  Head: Normocephalic.  Eyes: Pupils are equal, round, and reactive to light.  Neck: Normal range of motion.  Cardiovascular: Normal rate, regular rhythm and normal heart sounds.   Respiratory: Effort normal and breath sounds normal.  GI: Soft. Bowel sounds are  normal.  Genitourinary: Vagina normal.  Musculoskeletal: Normal range of motion.  Neurological: She is alert and oriented to person, place, and time. She has normal reflexes.  Skin: Skin is warm and dry.  Psychiatric: She has a normal mood and affect. Her behavior is normal.    Prenatal labs: ABO, Rh:   Bpos Antibody:  neg Rubella:  NOT-immune  RPR:   NR  HBsAg:   neg HIV:   neg  GBS:   pos   Assessment/Plan: IUP Di/Di twins at [redacted]w[redacted]d GHTN GBS pos FHR reassuring x2 Vtx/vtx confirmed by limited US today  Hx HSV, on valtrex denies recent outbreaks, spec exam clear  Rubella NI, offer vaccine PP     Admit to b.s. Per c/w Dr Estanislado Pandy Routine L&D orders Pitocin per protocol PCN per protocol  IV labetalol prn BP >160/105  Epidural prn  rv'd r/b including possible need for C-section for 2nd baby, pt verbalized understanding and wishes to proceed w vaginal delivery   Naethan Bracewell M 09/03/2013, 4:11 AM

## 2013-09-03 NOTE — Progress Notes (Signed)
Sanda KleinShelley Lillard, CNM at bedside discussing risks and benefits of c/s with patient. Decision made to perform c/s.

## 2013-09-03 NOTE — Anesthesia Preprocedure Evaluation (Signed)
Anesthesia Evaluation  Patient identified by MRN, date of birth, ID band Patient awake    Reviewed: Allergy & Precautions, H&P , NPO status , Patient's Chart, lab work & pertinent test results, reviewed documented beta blocker date and time   History of Anesthesia Complications Negative for: history of anesthetic complications  Airway       Dental   Pulmonary asthma , former smoker,          Cardiovascular negative cardio ROS      Neuro/Psych  Headaches, negative psych ROS   GI/Hepatic Neg liver ROS, GERD-  Medicated,  Endo/Other  Morbid obesity  Renal/GU negative Renal ROS  Female GU complaint     Musculoskeletal   Abdominal   Peds  Hematology negative hematology ROS (+)   Anesthesia Other Findings   Reproductive/Obstetrics (+) Pregnancy (twins, preeclampsia)                           Anesthesia Physical Anesthesia Plan  ASA: III and emergent  Anesthesia Plan: Spinal   Post-op Pain Management:    Induction:   Airway Management Planned:   Additional Equipment:   Intra-op Plan:   Post-operative Plan:   Informed Consent: I have reviewed the patients History and Physical, chart, labs and discussed the procedure including the risks, benefits and alternatives for the proposed anesthesia with the patient or authorized representative who has indicated his/her understanding and acceptance.     Plan Discussed with: Surgeon and CRNA  Anesthesia Plan Comments:         Anesthesia Quick Evaluation

## 2013-09-03 NOTE — Anesthesia Procedure Notes (Signed)
Spinal  Patient location during procedure: OR Start time: 09/03/2013 9:26 PM Staffing Performed by: anesthesiologist  Preanesthetic Checklist Completed: patient identified, site marked, surgical consent, pre-op evaluation, timeout performed, IV checked, risks and benefits discussed and monitors and equipment checked Spinal Block Patient position: sitting Prep: site prepped and draped and DuraPrep Patient monitoring: heart rate, cardiac monitor, continuous pulse ox and blood pressure Approach: midline Location: L3-4 Injection technique: single-shot Needle Needle type: Pencan  Needle gauge: 24 G Needle length: 9 cm Assessment Sensory level: T4 Additional Notes Clear free flow CSF on first attempt.  No paresthesia.  Patient tolerated procedure well with no apparent complications.  Jasmine DecemberA. Cassidy, MD

## 2013-09-03 NOTE — MAU Provider Note (Signed)
Chief Complaint:  Labor Eval  First Provider Initiated Contact with Patient 09/02/13 2310     HPI: Elizabeth Adams is a 33 y.o. G1P0 at 2435w2d with DI/DI twins who presents to maternity admissions reporting worsening contractions, but still irregular. States she was in the office today and cervical exam was 3/100%. Per patient babies were vertex vertex office last week. Plans to attempt vaginal delivery. Denies headache, vision changes, epigastric pain, leakage of fluid or vaginal bleeding. Good fetal movement. No history of hypertension before during pregnancy per patient.  States contractions have decreased in intensity since arrival to maternity admissions.  Past Medical History: Past Medical History  Diagnosis Date  . PCOS (polycystic ovarian syndrome)   . H/O varicella   . Asthma   . Headache(784.0)     Frequent  . H/O bacterial infection   . Yeast infection   . 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 Menses, irregular   . Hx: UTI (urinary tract infection)   . CIN II (cervical intraepithelial neoplasia II) 01/14/03  . CIN III (cervical intraepithelial neoplasia grade III) with severe dysplasia 01/14/03  . Abnormal Pap smear 2000, 2004 & 2005  . H/O female hirsutism     Past obstetric history: OB History  Gravida Para Term Preterm AB SAB TAB Ectopic Multiple Living  1             # Outcome Date GA Lbr Len/2nd Weight Sex Delivery Anes PTL Lv  1 CUR               Past Surgical History: Past Surgical History  Procedure Laterality Date  . No past surgeries       Family History: Family History  Problem Relation Age of Onset  . Thyroid disease Mother   . Hypertension Father     Social History: History  Substance Use Topics  . Smoking status: Former Smoker    Types: Cigarettes    Quit date: 01/21/2012  . Smokeless tobacco: Not on file  . Alcohol Use: Yes    Allergies:  Allergies  Allergen Reactions  . Doxycycline Shortness Of Breath    Meds:  Prescriptions prior to  admission  Medication Sig Dispense Refill  . Prenatal Vit-Fe Fumarate-FA (MULTIVITAMIN-PRENATAL) 27-0.8 MG TABS tablet Take 1 tablet by mouth daily at 12 noon.      . ranitidine (ZANTAC) 150 MG tablet Take 150 mg by mouth 2 (two) times daily.      . valACYclovir (VALTREX) 500 MG tablet Take 500 mg by mouth 2 (two) times daily.        ROS: Pertinent findings in history of present illness.  Physical Exam  Blood pressure 164/91, pulse 100, temperature 98 F (36.7 C), resp. rate 22, height 5' (1.524 m), weight 93.895 kg (207 lb), last menstrual period 12/16/2012.  Patient Vitals for the past 24 hrs:  BP Temp Temp src Pulse Resp Height Weight  09/03/13 0222 120/78 mmHg - - 91 - - -  09/03/13 0217 144/93 mmHg - - 98 - - -  09/03/13 0215 133/96 mmHg - - 106 - - -  09/03/13 0147 151/107 mmHg - - 101 - - -  09/03/13 0132 152/104 mmHg - - 102 - - -  09/03/13 0120 164/91 mmHg - - 100 - - -  09/03/13 0102 151/96 mmHg - - 101 - - -  09/03/13 0047 154/107 mmHg - - 101 - - -  09/03/13 0032 135/101 mmHg - - 95 - - -  09/03/13  0017 144/96 mmHg - - 102 - - -  09/03/13 0002 149/97 mmHg - - 100 - - -  09/02/13 2347 151/107 mmHg - - 100 - - -  09/02/13 2332 152/100 mmHg - - 97 - - -  09/02/13 2302 156/101 mmHg - - 96 - - -  09/02/13 2251 149/105 mmHg - - 102 - - -  09/02/13 2238 158/98 mmHg - - 96 - - -  09/02/13 2236 158/98 mmHg - - 96 - - -  09/02/13 2216 155/98 mmHg 98 F (36.7 C) - 99 22 5' (1.524 m) 93.895 kg (207 lb)    GENERAL: Well-developed, well-nourished female in no acute distress.  HEENT: normocephalic HEART: normal rate RESP: normal effort ABDOMEN: Soft, non-tender, gravid size greater than dates. Positive fetal movement palpated. EXTREMITIES: Nontender, 3+ lower extremity edema bilaterally. NEURO: alert and oriented SPECULUM EXAM: Deferred Dilation: 1 Effacement (%): 100 Station: -3 Presentation: Vertex Exam by:: [place], CNM No lesions seen on bimanual exam.  FHT:   Baby  A Baseline 120's , moderate variability, accelerations present, no decelerations  Baby B Baseline 120's , moderate variability, accelerations present, no decelerations Contractions: q 2-6 mins, mild   Labs: Results for orders placed during the hospital encounter of 09/02/13 (from the past 24 hour(s))  URINALYSIS, ROUTINE W REFLEX MICROSCOPIC     Status: Abnormal   Collection Time    09/02/13 11:21 PM      Result Value Range   Color, Urine YELLOW  YELLOW   APPearance CLEAR  CLEAR   Specific Gravity, Urine <1.005 (*) 1.005 - 1.030   pH 6.5  5.0 - 8.0   Glucose, UA NEGATIVE  NEGATIVE mg/dL   Hgb urine dipstick TRACE (*) NEGATIVE   Bilirubin Urine NEGATIVE  NEGATIVE   Ketones, ur NEGATIVE  NEGATIVE mg/dL   Protein, ur NEGATIVE  NEGATIVE mg/dL   Urobilinogen, UA 0.2  0.0 - 1.0 mg/dL   Nitrite NEGATIVE  NEGATIVE   Leukocytes, UA LARGE (*) NEGATIVE  PROTEIN / CREATININE RATIO, URINE     Status: Abnormal   Collection Time    09/02/13 11:21 PM      Result Value Range   Creatinine, Urine 55.11     Total Protein, Urine 14.3     PROTEIN CREATININE RATIO 0.26 (*) 0.00 - 0.15  URINE MICROSCOPIC-ADD ON     Status: Abnormal   Collection Time    09/02/13 11:21 PM      Result Value Range   Squamous Epithelial / LPF FEW (*) RARE   WBC, UA 11-20  <3 WBC/hpf   RBC / HPF 0-2  <3 RBC/hpf   Bacteria, UA RARE  RARE  CBC     Status: Abnormal   Collection Time    09/02/13 11:45 PM      Result Value Range   WBC 12.3 (*) 4.0 - 10.5 K/uL   RBC 4.43  3.87 - 5.11 MIL/uL   Hemoglobin 13.8  12.0 - 15.0 g/dL   HCT 40.9  81.1 - 91.4 %   MCV 90.1  78.0 - 100.0 fL   MCH 31.2  26.0 - 34.0 pg   MCHC 34.6  30.0 - 36.0 g/dL   RDW 78.2  95.6 - 21.3 %   Platelets 219  150 - 400 K/uL  COMPREHENSIVE METABOLIC PANEL     Status: Abnormal   Collection Time    09/02/13 11:45 PM      Result Value Range   Sodium 138  137 - 147 mEq/L   Potassium 3.8  3.7 - 5.3 mEq/L   Chloride 102  96 - 112 mEq/L   CO2 22  19 -  32 mEq/L   Glucose, Bld 75  70 - 99 mg/dL   BUN 9  6 - 23 mg/dL   Creatinine, Ser 1.61  0.50 - 1.10 mg/dL   Calcium 09.6  8.4 - 04.5 mg/dL   Total Protein 5.9 (*) 6.0 - 8.3 g/dL   Albumin 2.6 (*) 3.5 - 5.2 g/dL   AST 33  0 - 37 U/L   ALT 11  0 - 35 U/L   Alkaline Phosphatase 245 (*) 39 - 117 U/L   Total Bilirubin 0.2 (*) 0.3 - 1.2 mg/dL   GFR calc non Af Amer 79 (*) >90 mL/min   GFR calc Af Amer >90  >90 mL/min  TYPE AND SCREEN     Status: None   Collection Time    09/02/13 11:45 PM      Result Value Range   ABO/RH(D) B POS     Antibody Screen NEG     Sample Expiration 09/05/2013      Imaging:  No results found.  MAU Course: CBC, CMET, protein creatinine ratio, IV.  Dr. Estanislado Pandy notified of elevated blood pressures, normal labs and cervical exam. IV labetalol ordered. Sanda Klein, CNM coming to assess patient and her response to labetalol.  Assessment: Hypertension in pregnancy without significant proteinuria. 33.2 week DI/DI twin gestation. Fetal heart rate category 1 x 2  Plan: Admit for induction of labor. Sanda Klein, CNM assuming care of patient.  Wall, CNM 09/03/2013 4:09 AM

## 2013-09-04 ENCOUNTER — Encounter (HOSPITAL_COMMUNITY)
Admission: AD | Disposition: A | Discharge status: Home / Self Care | Payer: Self-pay | Source: Ambulatory Visit | Attending: Obstetrics and Gynecology

## 2013-09-04 ENCOUNTER — Encounter (HOSPITAL_COMMUNITY): Payer: Self-pay | Admitting: *Deleted

## 2013-09-04 LAB — CBC
HCT: 33.7 % — ABNORMAL LOW (ref 36.0–46.0)
HEMOGLOBIN: 11.3 g/dL — AB (ref 12.0–15.0)
MCH: 30.5 pg (ref 26.0–34.0)
MCHC: 33.5 g/dL (ref 30.0–36.0)
MCV: 90.8 fL (ref 78.0–100.0)
PLATELETS: 214 10*3/uL (ref 150–400)
RBC: 3.71 MIL/uL — ABNORMAL LOW (ref 3.87–5.11)
RDW: 14.6 % (ref 11.5–15.5)
WBC: 14.6 10*3/uL — ABNORMAL HIGH (ref 4.0–10.5)

## 2013-09-04 SURGERY — Surgical Case
Anesthesia: Regional

## 2013-09-04 MED ORDER — NALOXONE HCL 0.4 MG/ML IJ SOLN: 0.4000 mg | INTRAMUSCULAR | Status: DC | PRN

## 2013-09-04 MED ORDER — SIMETHICONE 80 MG PO CHEW
80.0000 mg | CHEWABLE_TABLET | ORAL | Status: DC
  Administered 2013-09-04 – 2013-09-05 (×2): 80 mg via ORAL
  Filled 2013-09-04 (×2): qty 1

## 2013-09-04 MED ORDER — SENNOSIDES-DOCUSATE SODIUM 8.6-50 MG PO TABS
2.0000 | ORAL_TABLET | ORAL | Status: DC
Start: 2013-09-04 — End: 2013-09-06
  Administered 2013-09-04 – 2013-09-05 (×2): 2 via ORAL
  Filled 2013-09-04 (×2): qty 2

## 2013-09-04 MED ORDER — ONDANSETRON HCL 4 MG PO TABS: 4.0000 mg | ORAL_TABLET | ORAL | Status: DC | PRN

## 2013-09-04 MED ORDER — MENTHOL 3 MG MT LOZG: 1.0000 | LOZENGE | OROMUCOSAL | Status: DC | PRN

## 2013-09-04 MED ORDER — SODIUM CHLORIDE 0.9 % IJ SOLN: 3.0000 mL | INTRAMUSCULAR | Status: DC | PRN

## 2013-09-04 MED ORDER — WITCH HAZEL-GLYCERIN EX PADS: 1.0000 "application " | MEDICATED_PAD | CUTANEOUS | Status: DC | PRN

## 2013-09-04 MED ORDER — FAMOTIDINE 20 MG PO TABS
40.0000 mg | ORAL_TABLET | Freq: Two times a day (BID) | ORAL | Status: DC
  Administered 2013-09-04 – 2013-09-06 (×4): 40 mg via ORAL
  Filled 2013-09-04 (×5): qty 2

## 2013-09-04 MED ORDER — DIPHENHYDRAMINE HCL 50 MG/ML IJ SOLN: 12.5000 mg | INTRAMUSCULAR | Status: DC | PRN

## 2013-09-04 MED ORDER — ONDANSETRON HCL 4 MG/2ML IJ SOLN
4.0000 mg | INTRAMUSCULAR | Status: DC | PRN
  Administered 2013-09-04 (×2): 4 mg via INTRAVENOUS
  Filled 2013-09-04 (×2): qty 2

## 2013-09-04 MED ORDER — KETOROLAC TROMETHAMINE 30 MG/ML IJ SOLN: 30.0000 mg | Freq: Four times a day (QID) | INTRAMUSCULAR | Status: AC | PRN

## 2013-09-04 MED ORDER — ZOLPIDEM TARTRATE 5 MG PO TABS: 5.0000 mg | ORAL_TABLET | Freq: Every evening | ORAL | Status: DC | PRN

## 2013-09-04 MED ORDER — OXYTOCIN 40 UNITS IN LACTATED RINGERS INFUSION - SIMPLE MED: 62.5000 mL/h | INTRAVENOUS | Status: AC

## 2013-09-04 MED ORDER — NALBUPHINE HCL 10 MG/ML IJ SOLN: 5.0000 mg | INTRAMUSCULAR | Status: DC | PRN

## 2013-09-04 MED ORDER — DIPHENHYDRAMINE HCL 25 MG PO CAPS
25.0000 mg | ORAL_CAPSULE | ORAL | Status: DC | PRN
  Administered 2013-09-04 (×2): 25 mg via ORAL
  Filled 2013-09-04: qty 1

## 2013-09-04 MED ORDER — OXYCODONE-ACETAMINOPHEN 5-325 MG PO TABS
1.0000 | ORAL_TABLET | ORAL | Status: DC | PRN
  Administered 2013-09-05: 1 via ORAL
  Administered 2013-09-05 (×2): 2 via ORAL
  Administered 2013-09-06 (×2): 1 via ORAL
  Filled 2013-09-04 (×2): qty 2
  Filled 2013-09-04 (×3): qty 1

## 2013-09-04 MED ORDER — DIPHENHYDRAMINE HCL 25 MG PO CAPS
25.0000 mg | ORAL_CAPSULE | Freq: Four times a day (QID) | ORAL | Status: DC | PRN
  Filled 2013-09-04: qty 1

## 2013-09-04 MED ORDER — DIBUCAINE 1 % RE OINT: 1.0000 "application " | TOPICAL_OINTMENT | RECTAL | Status: DC | PRN

## 2013-09-04 MED ORDER — KETOROLAC TROMETHAMINE 30 MG/ML IJ SOLN
30.0000 mg | Freq: Four times a day (QID) | INTRAMUSCULAR | Status: AC | PRN
Start: 2013-09-04 — End: 2013-09-05

## 2013-09-04 MED ORDER — DIPHENHYDRAMINE HCL 50 MG/ML IJ SOLN: 25.0000 mg | INTRAMUSCULAR | Status: DC | PRN

## 2013-09-04 MED ORDER — METOCLOPRAMIDE HCL 5 MG/ML IJ SOLN
10.0000 mg | Freq: Three times a day (TID) | INTRAMUSCULAR | Status: DC | PRN
  Administered 2013-09-04: 10 mg via INTRAVENOUS
  Filled 2013-09-04: qty 2

## 2013-09-04 MED ORDER — TETANUS-DIPHTH-ACELL PERTUSSIS 5-2.5-18.5 LF-MCG/0.5 IM SUSP: 0.5000 mL | Freq: Once | INTRAMUSCULAR | Status: DC

## 2013-09-04 MED ORDER — NALOXONE HCL 1 MG/ML IJ SOLN
1.0000 ug/kg/h | INTRAVENOUS | Status: DC | PRN
  Filled 2013-09-04: qty 2

## 2013-09-04 MED ORDER — SIMETHICONE 80 MG PO CHEW
80.0000 mg | CHEWABLE_TABLET | Freq: Three times a day (TID) | ORAL | Status: DC
  Administered 2013-09-04 – 2013-09-06 (×6): 80 mg via ORAL
  Filled 2013-09-04 (×7): qty 1

## 2013-09-04 MED ORDER — IBUPROFEN 600 MG PO TABS
600.0000 mg | ORAL_TABLET | Freq: Four times a day (QID) | ORAL | Status: DC
  Administered 2013-09-04 – 2013-09-06 (×9): 600 mg via ORAL
  Filled 2013-09-04 (×9): qty 1

## 2013-09-04 MED ORDER — BISACODYL 10 MG RE SUPP: 10.0000 mg | Freq: Every day | RECTAL | Status: DC | PRN

## 2013-09-04 MED ORDER — PRENATAL MULTIVITAMIN CH
1.0000 | ORAL_TABLET | Freq: Every day | ORAL | Status: DC
Start: 2013-09-04 — End: 2013-09-06
  Administered 2013-09-04 – 2013-09-05 (×2): 1 via ORAL
  Filled 2013-09-04 (×2): qty 1

## 2013-09-04 MED ORDER — MEASLES, MUMPS & RUBELLA VAC ~~LOC~~ INJ
0.5000 mL | INJECTION | Freq: Once | SUBCUTANEOUS | Status: DC
  Filled 2013-09-04: qty 0.5

## 2013-09-04 MED ORDER — LACTATED RINGERS IV SOLN
INTRAVENOUS | Status: DC
  Administered 2013-09-04 (×2): via INTRAVENOUS

## 2013-09-04 MED ORDER — LANOLIN HYDROUS EX OINT: 1.0000 "application " | TOPICAL_OINTMENT | CUTANEOUS | Status: DC | PRN

## 2013-09-04 MED ORDER — ONDANSETRON HCL 4 MG/2ML IJ SOLN: 4.0000 mg | Freq: Three times a day (TID) | INTRAMUSCULAR | Status: DC | PRN

## 2013-09-04 MED ORDER — FERROUS SULFATE 325 (65 FE) MG PO TABS
325.0000 mg | ORAL_TABLET | Freq: Two times a day (BID) | ORAL | Status: DC
  Administered 2013-09-04 – 2013-09-06 (×5): 325 mg via ORAL
  Filled 2013-09-04 (×5): qty 1

## 2013-09-04 MED ORDER — FENTANYL CITRATE 0.05 MG/ML IJ SOLN
INTRAMUSCULAR | Status: AC
  Filled 2013-09-04: qty 2

## 2013-09-04 MED ORDER — FLEET ENEMA 7-19 GM/118ML RE ENEM: 1.0000 | ENEMA | Freq: Every day | RECTAL | Status: DC | PRN

## 2013-09-04 MED ORDER — SIMETHICONE 80 MG PO CHEW: 80.0000 mg | CHEWABLE_TABLET | ORAL | Status: DC | PRN

## 2013-09-04 SURGICAL SUPPLY — 41 items
APL SKNCLS STERI-STRIP NONHPOA (GAUZE/BANDAGES/DRESSINGS)
BENZOIN TINCTURE PRP APPL 2/3 (GAUZE/BANDAGES/DRESSINGS) ×1 IMPLANT
CLAMP CORD UMBIL (MISCELLANEOUS) IMPLANT
CLOSURE WOUND 1/2 X4 (GAUZE/BANDAGES/DRESSINGS)
CLOTH BEACON ORANGE TIMEOUT ST (SAFETY) ×1 IMPLANT
CONTAINER PREFILL 10% NBF 15ML (MISCELLANEOUS) IMPLANT
DRAIN JACKSON PRT FLT 10 (DRAIN) IMPLANT
DRAPE LG THREE QUARTER DISP (DRAPES) IMPLANT
DRSG OPSITE POSTOP 4X10 (GAUZE/BANDAGES/DRESSINGS) ×1 IMPLANT
DURAPREP 26ML APPLICATOR (WOUND CARE) ×1 IMPLANT
ELECT REM PT RETURN 9FT ADLT (ELECTROSURGICAL)
ELECTRODE REM PT RTRN 9FT ADLT (ELECTROSURGICAL) ×1 IMPLANT
EVACUATOR SILICONE 100CC (DRAIN) IMPLANT
EXTRACTOR VACUUM M CUP 4 TUBE (SUCTIONS) IMPLANT
EXTRACTOR VACUUM M CUP 4' TUBE (SUCTIONS)
GLOVE BIO SURGEON STRL SZ 6.5 (GLOVE) ×1 IMPLANT
GLOVE BIO SURGEONS STRL SZ 6.5 (GLOVE)
GLOVE BIOGEL PI IND STRL 7.0 (GLOVE) ×1 IMPLANT
GLOVE BIOGEL PI INDICATOR 7.0 (GLOVE)
GOWN PREVENTION PLUS XLARGE (GOWN DISPOSABLE) ×1 IMPLANT
GOWN STRL REIN XL XLG (GOWN DISPOSABLE) ×1 IMPLANT
KIT ABG SYR 3ML LUER SLIP (SYRINGE) IMPLANT
NDL HYPO 25X5/8 SAFETYGLIDE (NEEDLE) IMPLANT
NEEDLE HYPO 25X5/8 SAFETYGLIDE (NEEDLE) IMPLANT
NS IRRIG 1000ML POUR BTL (IV SOLUTION) ×1 IMPLANT
PACK C SECTION WH (CUSTOM PROCEDURE TRAY) ×1 IMPLANT
PAD OB MATERNITY 4.3X12.25 (PERSONAL CARE ITEMS) ×1 IMPLANT
RTRCTR C-SECT PINK 25CM LRG (MISCELLANEOUS) IMPLANT
STAPLER VISISTAT 35W (STAPLE) IMPLANT
STRIP CLOSURE SKIN 1/2X4 (GAUZE/BANDAGES/DRESSINGS) ×1 IMPLANT
SUT CHROMIC 0 CT 1 (SUTURE) ×1 IMPLANT
SUT MNCRL AB 3-0 PS2 27 (SUTURE) IMPLANT
SUT PLAIN 2 0 (SUTURE)
SUT PLAIN 2 0 XLH (SUTURE) ×1 IMPLANT
SUT PLAIN ABS 2-0 CT1 27XMFL (SUTURE) ×2 IMPLANT
SUT SILK 2 0 SH (SUTURE) IMPLANT
SUT VIC AB 0 CTX 36 (SUTURE)
SUT VIC AB 0 CTX36XBRD ANBCTRL (SUTURE) ×4 IMPLANT
TOWEL OR 17X24 6PK STRL BLUE (TOWEL DISPOSABLE) ×1 IMPLANT
TRAY FOLEY CATH 14FR (SET/KITS/TRAYS/PACK) ×1 IMPLANT
WATER STERILE IRR 1000ML POUR (IV SOLUTION) ×1 IMPLANT

## 2013-09-04 NOTE — Lactation Note (Signed)
This note was copied from the chart of Elizabeth Adams [facility]. Lactation Consultation Note  Patient Name: Elizabeth RuizBoyB Elizabeth Adams ZOXWR'UToday's Date: 09/04/2013 Reason for consult: Follow-up assessment Baby has not been to breast since early this morning due to sleeping and then circumcision. Assisted mom to place baby boy B to breast using football position. Baby boy B would not latch, too sleepy. Assisted mom with hand expression. Able to express 5 ml of colostrum and fed to baby with spoon. Mom held baby STS after feeding. Baby tolerated well. Set up DEBP with instruction. Plan is for mom to offer breast with cues, and STS every 3 hours if no cues. Mom should pump with DEBP after offering the breasts and give EBM to both babies since babies are not latching and nursing well. Reviewed basics with mom and enc as much STS as possible with both babies. Updated [facility] RN with results and plan.   Maternal Data Formula Feeding for Exclusion: No Has patient been taught Hand Expression?: Yes Does the patient have breastfeeding experience prior to this delivery?: No  Feeding Feeding Type: Breast Milk  LATCH Score/Interventions Latch: Too sleepy or reluctant, no latch achieved, no sucking elicited.                    Lactation Tools Discussed/Used Pump Review: Setup, frequency, and cleaning Initiated by:: JW Date initiated:: 09/04/13   Consult Status Consult Status: Follow-up Date: 09/04/13 Follow-up type: In-patient    Elizabeth Adams, Elizabeth Adams 09/04/2013, 8:01 PM

## 2013-09-04 NOTE — Progress Notes (Signed)
Subjective: Postpartum Day 1: Cesarean Delivery due to Twins, patient request after induction Patient stood at bedside, felt weak returned to bed. Has not be up yet, but denies syncope or dizziness. Feeding:  Breast Contraceptive plan:  Discussed, but unsure  Objective: Vital signs in last 24 hours: Temp:  [97.3 F (36.3 C)-98.1 F (36.7 C)] 97.5 F (36.4 C) (01/16 0557) Pulse Rate:  [87-111] 87 (01/16 0735) Resp:  [13-23] 16 (01/16 0735) BP: (107-153)/(52-125) 153/98 mmHg (01/16 0735) SpO2:  [95 %-100 %] 97 % (01/16 0735)  Physical Exam:  General: alert, cooperative and fatigued Lochia: appropriate Uterine Fundus: firm  Incision: no significant drainage DVT Evaluation: No evidence of DVT seen on physical exam. Negative Homan's sign. JP drain:   None Filed Vitals:   09/04/13 0557 09/04/13 0604 09/04/13 0607 09/04/13 0735  BP: 152/96 135/82 123/84 153/98  Pulse: 93 107 111 87  Temp: 97.5 F (36.4 C)     TempSrc:      Resp: 18   16  Height:      Weight:      SpO2: 98%   97%    Recent Labs  09/03/13 2020 09/04/13 0626  HGB 12.7 11.3*  HCT 37.0 33.7*    Assessment/Plan: Status post Cesarean section day 1-Twins Gestational HTN/Stable-No meds Doing well postoperatively.  Breastfeeding  Continue current care. RN plans to ambulate and d/c foley this afternoon.    Nigel BridgemanLATHAM, [place] 09/04/2013, 10:33 AM

## 2013-09-04 NOTE — Anesthesia Postprocedure Evaluation (Signed)
  Anesthesia Post-op Note  Patient: Elizabeth Adams  Procedure(s) Performed: * No procedures listed *  Patient Location: PACU and Mother/Baby  Anesthesia Type:Spinal  Level of Consciousness: awake, alert , oriented and patient cooperative  Airway and Oxygen Therapy: Patient Spontanous Breathing  Post-op Pain: none  Post-op Assessment: Post-op Vital signs reviewed, Patient's Cardiovascular Status Stable and Respiratory Function Stable  Post-op Vital Signs: Reviewed and stable  Complications: No apparent anesthesia complications

## 2013-09-04 NOTE — Lactation Note (Signed)
This note was copied from the chart of Elizabeth Adams. Lactation Consultation Note  Patient Name: Elizabeth Adams Today's Date: 09/04/2013  Initial consultation with mom. Babies' are in the nursery for their circumcisions. Mom to call out when they are back and showing feeding cues. Enc mom to feed with cues, do lots of STS, reviewed [facility] brochure. Mom aware of OP/BFSG services.   Maternal Data    Feeding    LATCH Score/Interventions                      Lactation Tools Discussed/Used     Consult Status      Shamra Bradeen 09/04/2013, 3:25 PM    

## 2013-09-04 NOTE — Anesthesia Postprocedure Evaluation (Signed)
  Anesthesia Post-op Note  Anesthesia Post Note  Patient: Elizabeth SellsDawn M Ferg  Procedure(s) Performed: Procedure(s) (LRB): CESAREAN SECTION (N/A)  Anesthesia type: Spinal  Patient location: PACU  Post pain: Pain level controlled  Post assessment: Post-op Vital signs reviewed  Last Vitals:  Filed Vitals:   09/03/13 2330  BP: 129/77  Pulse: 97  Temp:   Resp: 23    Post vital signs: Reviewed  Level of consciousness: awake  Complications: No apparent anesthesia complications

## 2013-09-05 NOTE — Lactation Note (Signed)
This note was copied from the chart of Elizabeth Feliberto [facility]Dawn Well. Lactation Consultation Note  Patient Name: Elizabeth Adams ZOXWR'UToday's Date: 09/05/2013 Reason for consult: Follow-up assessment;Multiple gestation;Late preterm infant.  Both twins are primarily receiving ebm or formula via syringe, although "B" is latching better and sucking stronger and more consistently, per mom.  Mom has DEBP and encouraged to pump 15 minutes every 3 hours and continue syringe feedings for both babies.  RN, Shanda BumpsJessica states that mom is following the intake guidelines based on babies hour of life.  Twins will be 2048 hours old tonight and will increase need to 18-25 ml's every 3 hours.  Mom to call for latch assistance this evening and continue regular pumping.  LC reviewed milk storage guidelines, per Baby and Me (page 25).   Maternal Data Formula Feeding for Exclusion: Yes Reason for exclusion: Mother's choice to formula and breast feed on admission  Feeding    LATCH Score/Interventions           nolatch score for "A", one successful 20 minute breastfeeding for "B" today but no latch score documented           Lactation Tools Discussed/Used Pump Review: Milk Storage;Setup, frequency, and cleaning (reinforced instructions) Discussed simultaneous breastfeeding and using football position, starting "B" and then try latching "A"  Consult Status Consult Status: Follow-up Date: 09/06/13 Follow-up type: In-patient    Elizabeth Adams, Elizabeth Adams [facility] 09/05/2013, 7:28 PM

## 2013-09-05 NOTE — Progress Notes (Signed)
Subjective: Postpartum Day 2: Cesarean Delivery Patient reports feeling much improved today.  No further nausea or vomiting since last PM.  Ambulating, voiding and tol po liquids and solids without difficulty.  Denies weakness or dizziness.  Reports pain controlled with meds.  Working on breastfeeding.  Reports decreased lochia.  Pos flatus, neg BM.      Objective: Vital signs in last 24 hours: Temp:  [99.3 F (37.4 C)-99.6 F (37.6 C)] 99.3 F (37.4 C) (01/17 0629) Pulse Rate:  [91-98] 98 (01/17 0629) Resp:  [18] 18 (01/17 0629) BP: (117-147)/(73-87) 117/73 mmHg (01/17 0629) SpO2:  [93 %] 93 % (01/16 2157)  Physical Exam:  General: alert, cooperative and no distress Heart:  RRR Lungs:  CTA bilat Abd:  Soft, NT with pos BS x 4 quads Lochia: appropriate, sm rubra Uterine Fundus: firm, NT 1 below umb Incision: Pressure dsg with 4cm area bloody drainage noted midline inferior portion.  Pressure dsg removed and honeycomb dsg 2/3 saturated with blood drainage, light red.  Honeycomb dsg non-adherent and removed.  Steristrips intact with bloody drainage noted midline and rt side of incision.  Incision appears intact.  No redness noted.  No current drainage noted.  Incision soft without evidence of hematoma/seroma noted.   DVT Evaluation: No evidence of DVT seen on physical exam. Negative Homan's sign bilat. 1+ Calf/Ankle edema is present. DTRs 2+, no clonus   Recent Labs  09/03/13 2020 09/04/13 0626  HGB 12.7 11.3*  HCT 37.0 33.7*    Assessment/Plan: Status post Cesarean section-Twins @ 37w 2d Drainage of incison  RN to replace honeycomb dsg as well as pressure dsg.  Will continue to observe drainage.   Anticipate discharge tomorrow.   Lillianne Eick O. 09/05/2013, 5:43 PM

## 2013-09-06 LAB — TYPE AND SCREEN
ABO/RH(D): B POS
Antibody Screen: NEGATIVE
UNIT DIVISION: 0
Unit division: 0

## 2013-09-06 MED ORDER — IBUPROFEN 600 MG PO TABS: 600.0000 mg | ORAL_TABLET | Freq: Four times a day (QID) | ORAL | Status: DC

## 2013-09-06 MED ORDER — OXYCODONE-ACETAMINOPHEN 5-325 MG PO TABS: 1.0000 | ORAL_TABLET | ORAL | Status: DC | PRN

## 2013-09-06 NOTE — Discharge Summary (Signed)
Cesarean Section Delivery Discharge Summary  Elizabeth Adams  DOB:    1981/07/25 MRN:    [id number] CSN:    [id number]  Date of admission:                  09/02/2013  Date of discharge:                   1/18/205  Procedures this admission: primary c/s for twins, failed IOL w pitocin,   Date of Delivery: 09/03/2013   Newborn Data:    Gracie, Gupta [[id number]]  Live born female  Birth Weight: 6 lb 11.2 oz (3040 g) APGAR: 9, 9   Ophia, Shamoon [[id number]]  Live born female  Birth Weight: 6 lb 7.5 oz (2935 g) APGAR: 9, 9  Home with mother.  Circumcision Plan: done inpatient   History of Present Illness:  Ms. Elizabeth Adams is a 33 y.o. female, G1P1002, who presents at [redacted]w[redacted]d weeks gestation. The patient has been followed at the [facility] of [facility].    Her pregnancy has been complicated by:  Patient Active Problem List   Diagnosis Date Noted  . Twin, delivered by cesarean 09/04/2013  . Gestational hypertension 09/03/2013  . Twin pregnancy, twins dichorionic and diamniotic 09/03/2013  . History of GBS (group B streptococcus) UTI, currently pregnant 09/03/2013  . Infertility, female 01/30/2012  . Irregular bleeding 01/30/2012   none.  Hospital course:  The patient was admitted for hypertension, twin pregnancy. She was noted to have elevated BP's, tx'd w labetalol, pre-eclampsia was ruled out. We attempted IOL with pitocin, which was not successful, she then requested a c/s for twins. Her BP's remained overall stable.    Her postpartum course was  Complicated by some nausea and vomiting, which resolved, she also had bleeding at incision site and dressing was changed and bleeding appeared to resolve. She was discharged to home on postpartum day 3 doing well.  Feeding:  Breast, bottle, pumping   Contraception:  oral progesterone-only contraceptive  Discharge hemoglobin:  Hemoglobin  Date Value Range Status   09/04/2013 11.3* 12.0 - 15.0 g/dL Final     HCT  Date Value Range Status  09/04/2013 33.7* 36.0 - 46.0 % Final    Discharge Physical Exam:   General: alert and no distress Lochia: appropriate Uterine Fundus: firm Incision: steri strips on, incision well approximated, no apparent drainage  DVT Evaluation: No evidence of DVT seen on physical exam. Negative Homan's sign. No significant calf/ankle edema.  Intrapartum Procedures: cesarean: low cervical, transverse and GBS prophylaxis Postpartum Procedures: none Complications-Operative and Postpartum: none  Discharge Diagnoses: Term Pregnancy-delivered  Discharge Information:  Activity:           pelvic rest Diet:                routine Medications: PNV, Ibuprofen and Percocet Condition:      stable Instructions:  Care After Cesarean Delivery  Refer to this sheet in the next few weeks. These instructions provide you with information on caring for yourself after your procedure. Your caregiver may also give you specific instructions. Your treatment has been planned according to current medical practices, but problems sometimes occur. Call your caregiver if you have any problems or questions after you go home. HOME CARE INSTRUCTIONS  Only take over-the-counter or prescription medicines as directed by your caregiver.  Do not drink alcohol, especially if you are breastfeeding or taking medicine to  relieve pain.  Do not chew or smoke tobacco.  Continue to use good perineal care. Good perineal care includes:  Wiping your perineum from front to back.  Keeping your perineum clean.  Check your cut (incision) daily for increased redness, drainage, swelling, or separation of skin.  Clean your incision gently with soap and water every day, and then pat it dry. If your caregiver says it is okay, leave the incision uncovered. Use a bandage (dressing) if the incision is draining fluid or appears irritated. If the adhesive strips across  the incision do not fall off within 7 days, carefully peel them off.  Hug a pillow when coughing or sneezing until your incision is healed. This helps to relieve pain.  Do not use tampons or douche until your caregiver says it is okay.  Shower, wash your hair, and take tub baths as directed by your caregiver.  Wear a well-fitting bra that provides breast support.  Limit wearing support panties or control-top hose.  Drink enough fluids to keep your urine clear or pale yellow.  Eat high-fiber foods such as whole grain cereals and breads, brown rice, beans, and fresh fruits and vegetables every day. These foods may help prevent or relieve constipation.  Resume activities such as climbing stairs, driving, lifting, exercising, or traveling as directed by your caregiver.  Talk to your caregiver about resuming sexual activities. This is dependent upon your risk of infection, your rate of healing, and your comfort and desire to resume sexual activity.  Try to have someone help you with your household activities and your newborn for at least a few days after you leave the hospital.  Rest as much as possible. Try to rest or take a nap when your newborn is sleeping.  Increase your activities gradually.  Keep all of your scheduled postpartum appointments. It is very important to keep your scheduled follow-up appointments. At these appointments, your caregiver will be checking to make sure that you are healing physically and emotionally. SEEK MEDICAL CARE IF:   You are passing large clots from your vagina. Save any clots to show your caregiver.  You have a foul smelling discharge from your vagina.  You have trouble urinating.  You are urinating frequently.  You have pain when you urinate.  You have a change in your bowel movements.  You have increasing redness, pain, or swelling near your incision.  You have pus draining from your incision.  Your incision is separating.  You have  painful, hard, or reddened breasts.  You have a severe headache.  You have blurred vision or see spots.  You feel sad or depressed.  You have thoughts of hurting yourself or your newborn.  You have questions about your care, the care of your newborn, or medicines.  You are dizzy or lightheaded.  You have a rash.  You have pain, redness, or swelling at the site of the removed intravenous access (IV) tube.  You have nausea or vomiting.  You stopped breastfeeding and have not had a menstrual period within 12 weeks of stopping.  You are not breastfeeding and have not had a menstrual period within 12 weeks of delivery.  You have a fever. SEEK IMMEDIATE MEDICAL CARE IF:  You have persistent pain.  You have chest pain.  You have shortness of breath.  You faint.  You have leg pain.  You have stomach pain.  Your vaginal bleeding saturates 2 or more sanitary pads in 1 hour. MAKE SURE YOU:  Understand these instructions.  Will watch your condition.  Will get help right away if you are not doing well or get worse. Document Released: 04/28/2002 Document Revised: 04/30/2012 Document Reviewed: 04/02/2012 [facility] Patient Information 2014 [place].   Postpartum Depression and Baby Blues  The postpartum period begins right after the birth of a baby. During this time, there is often a great amount of joy and excitement. It is also a time of considerable changes in the life of the parent(s). Regardless of how many times a mother gives birth, each child brings new challenges and dynamics to the family. It is not unusual to have feelings of excitement accompanied by confusing shifts in moods, emotions, and thoughts. All mothers are at risk of developing postpartum depression or the "baby blues." These mood changes can occur right after giving birth, or they may occur many months after giving birth. The baby blues or postpartum depression can be mild or severe. Additionally,  postpartum depression can resolve rather quickly, or it can be a long-term condition. CAUSES Elevated hormones and their rapid decline are thought to be a main cause of postpartum depression and the baby blues. There are a number of hormones that radically change during and after pregnancy. Estrogen and progesterone usually decrease immediately after delivering your baby. The level of thyroid hormone and various cortisol steroids also rapidly drop. Other factors that play a major role in these changes include major life events and genetics.  RISK FACTORS If you have any of the following risks for the baby blues or postpartum depression, know what symptoms to watch out for during the postpartum period. Risk factors that may increase the likelihood of getting the baby blues or postpartum depression include:  Havinga personal or family history of depression.  Having depression while being pregnant.  Having premenstrual or oral contraceptive-associated mood issues.  Having exceptional life stress.  Having marital conflict.  Lacking a social support network.  Having a baby with special needs.  Having health problems such as diabetes. SYMPTOMS Baby blues symptoms include:  Brief fluctuations in mood, such as going from extreme happiness to sadness.  Decreased concentration.  Difficulty sleeping.  Crying spells, tearfulness.  Irritability.  Anxiety. Postpartum depression symptoms typically begin within the first month after giving birth. These symptoms include:  Difficulty sleeping or excessive sleepiness.  Marked weight loss.  Agitation.  Feelings of worthlessness.  Lack of interest in activity or food. Postpartum psychosis is a very concerning condition and can be dangerous. Fortunately, it is rare. Displaying any of the following symptoms is cause for immediate medical attention. Postpartum psychosis symptoms include:  Hallucinations and delusions.  Bizarre or  disorganized behavior.  Confusion or disorientation. DIAGNOSIS  A diagnosis is made by an evaluation of your symptoms. There are no medical or lab tests that lead to a diagnosis, but there are various questionnaires that a caregiver may use to identify those with the baby blues, postpartum depression, or psychosis. Often times, a screening tool called the [place] Postnatal Depression Scale is used to diagnose depression in the postpartum period.  TREATMENT The baby blues usually goes away on its own in 1 to 2 weeks. Social support is often all that is needed. You should be encouraged to get adequate sleep and rest. Occasionally, you may be given medicines to help you sleep.  Postpartum depression requires treatment as it can last several months or longer if it is not treated. Treatment may include individual or group therapy, medicine, or both to  address any social, physiological, and psychological factors that may play a role in the depression. Regular exercise, a healthy diet, rest, and social support may also be strongly recommended.  Postpartum psychosis is more serious and needs treatment right away. Hospitalization is often needed. HOME CARE INSTRUCTIONS  Get as much rest as you can. Nap when the baby sleeps.  Exercise regularly. Some women find yoga and walking to be beneficial.  Eat a balanced and nourishing diet.  Do little things that you enjoy. Have a cup of tea, take a bubble bath, read your favorite magazine, or listen to your favorite music.  Avoid alcohol.  Ask for help with household chores, cooking, grocery shopping, or running errands as needed. Do not try to do everything.  Talk to people close to you about how you are feeling. Get support from your partner, family members, friends, or other new moms.  Try to stay positive in how you think. Think about the things you are grateful for.  Do not spend a lot of time alone.  Only take medicine as directed by your  caregiver.  Keep all your postpartum appointments.  Let your caregiver know if you have any concerns. SEEK MEDICAL CARE IF: You are having a reaction or problems with your medicine. SEEK IMMEDIATE MEDICAL CARE IF:  You have suicidal feelings.  You feel you may harm the baby or someone else. Document Released: 05/10/2004 Document Revised: 10/29/2011 Document Reviewed: 06/12/2011 [facility] Patient Information 2014 [place], [place].  Discharge to: home  Follow-up Information   Follow up with [facility] In 6 weeks.   Specialty:  Obstetrics and Gynecology   Contact information:   [address] [place] [address] [phone]       Malissa Hippo 09/06/2013

## 2013-09-06 NOTE — Discharge Instructions (Signed)
Cesarean Delivery °Care After °Refer to this sheet in the next few weeks. These instructions provide you with information on caring for yourself after your procedure. Your health care provider may also give you specific instructions. Your treatment has been planned according to current medical practices, but problems sometimes occur. Call your health care provider if you have any problems or questions after you go home. °HOME CARE INSTRUCTIONS  °· Only take over-the-counter or prescription medications as directed by your health care provider. °· Do not drink alcohol, especially if you are breastfeeding or taking medication to relieve pain. °· Do not chew or smoke tobacco. °· Continue to use good perineal care. Good perineal care includes: °· Wiping your perineum from front to back. °· Keeping your perineum clean. °· Check your surgical cut (incision) daily for increased redness, drainage, swelling, or separation of skin. °· Clean your incision gently with soap and water every day, and then pat it dry. If your health care provider says it is OK, leave the incision uncovered. Use a bandage (dressing) if the incision is draining fluid or appears irritated. If the adhesive strips across the incision do not fall off within 7 days, carefully peel them off. °· Hug a pillow when coughing or sneezing until your incision is healed. This helps to relieve pain. °· Do not use tampons or douche until your health care provider says it is okay. °· Shower, wash your hair, and take tub baths as directed by your health care provider. °· Wear a well-fitting bra that provides breast support. °· Limit wearing support panties or control-top hose. °· Drink enough fluids to keep your urine clear or pale yellow. °· Eat high-fiber foods such as whole grain cereals and breads, brown rice, beans, and fresh fruits and vegetables every day. These foods may help prevent or relieve constipation. °· Resume activities such as climbing stairs,  driving, lifting, exercising, or traveling as directed by your health care provider. °· Talk to your health care provider about resuming sexual activities. This is dependent upon your risk of infection, your rate of healing, and your comfort and desire to resume sexual activity. °· Try to have someone help you with your household activities and your newborn for at least a few days after you leave the hospital. °· Rest as much as possible. Try to rest or take a nap when your newborn is sleeping. °· Increase your activities gradually. °· Keep all of your scheduled postpartum appointments. It is very important to keep your scheduled follow-up appointments. At these appointments, your health care provider will be checking to make sure that you are healing physically and emotionally. °SEEK MEDICAL CARE IF:  °· You are passing large clots from your vagina. Save any clots to show your health care provider. °· You have a foul smelling discharge from your vagina. °· You have trouble urinating. °· You are urinating frequently. °· You have pain when you urinate. °· You have a change in your bowel movements. °· You have increasing redness, pain, or swelling near your incision. °· You have pus draining from your incision. °· Your incision is separating. °· You have painful, hard, or reddened breasts. °· You have a severe headache. °· You have blurred vision or see spots. °· You feel sad or depressed. °· You have thoughts of hurting yourself or your newborn. °· You have questions about your care, the care of your newborn, or medications. °· You are dizzy or lightheaded. °· You have a rash. °· You   have pain, redness, or swelling at the site of the removed intravenous access (IV) tube.  You have nausea or vomiting.  You stopped breastfeeding and have not had a menstrual period within 12 weeks of stopping.  You are not breastfeeding and have not had a menstrual period within 12 weeks of delivery.  You have a fever. SEEK  IMMEDIATE MEDICAL CARE IF:  You have persistent pain.  You have chest pain.  You have shortness of breath.  You faint.  You have leg pain.  You have stomach pain.  Your vaginal bleeding saturates 2 or more sanitary pads in 1 hour. MAKE SURE YOU:   Understand these instructions.  Will watch your condition.  Will get help right away if you are not doing well or get worse. Document Released: 04/28/2002 Document Revised: 04/08/2013 Document Reviewed: 04/02/2012 [facility] Patient Information 2014 [place].  Breastfeeding Deciding to breastfeed is one of the best choices you can make for you and your baby. A change in hormones during pregnancy causes your breast tissue to grow and increases the number and size of your milk ducts. These hormones also allow proteins, sugars, and fats from your blood supply to make breast milk in your milk-producing glands. Hormones prevent breast milk from being released before your baby is born as well as prompt milk flow after birth. Once breastfeeding has begun, thoughts of your baby, as well as his or her sucking or crying, can stimulate the release of milk from your milk-producing glands.  BENEFITS OF BREASTFEEDING For Your Baby  Your first milk (colostrum) helps your baby's digestive system function better.   There are antibodies in your milk that help your baby fight off infections.   Your baby has a lower incidence of asthma, allergies, and sudden infant death syndrome.   The nutrients in breast milk are better for your baby than infant formulas and are designed uniquely for your baby's needs.   Breast milk improves your baby's brain development.   Your baby is less likely to develop other conditions, such as childhood obesity, asthma, or type 2 diabetes mellitus.  For You   Breastfeeding helps to create a very special bond between you and your baby.   Breastfeeding is convenient. Breast milk is always available at the  correct temperature and costs nothing.   Breastfeeding helps to burn calories and helps you lose the weight gained during pregnancy.   Breastfeeding makes your uterus contract to its prepregnancy size faster and slows bleeding (lochia) after you give birth.   Breastfeeding helps to lower your risk of developing type 2 diabetes mellitus, osteoporosis, and breast or ovarian cancer later in life. SIGNS THAT YOUR BABY IS HUNGRY Early Signs of Hunger  Increased alertness or activity.  Stretching.  Movement of the head from side to side.  Movement of the head and opening of the mouth when the corner of the mouth or cheek is stroked (rooting).  Increased sucking sounds, smacking lips, cooing, sighing, or squeaking.  Hand-to-mouth movements.  Increased sucking of fingers or hands. Late Signs of Hunger  Fussing.  Intermittent crying. Extreme Signs of Hunger Signs of extreme hunger will require calming and consoling before your baby will be able to breastfeed successfully. Do not wait for the following signs of extreme hunger to occur before you initiate breastfeeding:   Restlessness.  A loud, strong cry.   Screaming. BREASTFEEDING BASICS Breastfeeding Initiation  Find a comfortable place to sit or lie down, with your neck and back  well supported.  Place a pillow or rolled up blanket under your baby to bring him or her to the level of your breast (if you are seated). Nursing pillows are specially designed to help support your arms and your baby while you breastfeed.  Make sure that your baby's abdomen is facing your abdomen.   Gently massage your breast. With your fingertips, massage from your chest wall toward your nipple in a circular motion. This encourages milk flow. You may need to continue this action during the feeding if your milk flows slowly.  Support your breast with 4 fingers underneath and your thumb above your nipple. Make sure your fingers are well away from  your nipple and your baby's mouth.   Stroke your baby's lips gently with your finger or nipple.   When your baby's mouth is open wide enough, quickly bring your baby to your breast, placing your entire nipple and as much of the colored area around your nipple (areola) as possible into your baby's mouth.   More areola should be visible above your baby's upper lip than below the lower lip.   Your baby's tongue should be between his or her lower gum and your breast.   Ensure that your baby's mouth is correctly positioned around your nipple (latched). Your baby's lips should create a seal on your breast and be turned out (everted).  It is common for your baby to suck about 2 3 minutes in order to start the flow of breast milk. Latching Teaching your baby how to latch on to your breast properly is very important. An improper latch can cause nipple pain and decreased milk supply for you and poor weight gain in your baby. Also, if your baby is not latched onto your nipple properly, he or she may swallow some air during feeding. This can make your baby fussy. Burping your baby when you switch breasts during the feeding can help to get rid of the air. However, teaching your baby to latch on properly is still the best way to prevent fussiness from swallowing air while breastfeeding. Signs that your baby has successfully latched on to your nipple:    Silent tugging or silent sucking, without causing you pain.   Swallowing heard between every 3 4 sucks.    Muscle movement above and in front of his or her ears while sucking.  Signs that your baby has not successfully latched on to nipple:   Sucking sounds or smacking sounds from your baby while breastfeeding.  Nipple pain. If you think your baby has not latched on correctly, slip your finger into the corner of your baby's mouth to break the suction and place it between your baby's gums. Attempt breastfeeding initiation again. Signs of  Successful Breastfeeding Signs from your baby:   A gradual decrease in the number of sucks or complete cessation of sucking.   Falling asleep.   Relaxation of his or her body.   Retention of a small amount of milk in his or her mouth.   Letting go of your breast by himself or herself. Signs from you:  Breasts that have increased in firmness, weight, and size 1 3 hours after feeding.   Breasts that are softer immediately after breastfeeding.  Increased milk volume, as well as a change in milk consistency and color by the 5th day of breastfeeding.   Nipples that are not sore, cracked, or bleeding. Signs That Your [employer] is Getting Enough Milk  Wetting at least 3 diapers  in a 24-hour period. The urine should be clear and pale yellow by age 10 days.  At least 3 stools in a 24-hour period by age 10 days. The stool should be soft and yellow.  At least 3 stools in a 24-hour period by age 63 days. The stool should be seedy and yellow.  No loss of weight greater than 10% of birth weight during the first 3 days of age.  Average weight gain of 4 7 ounces (120 210 mL) per week after age 78 days.  Consistent daily weight gain by age 786 days, without weight loss after the age of 2 weeks. After a feeding, your baby may spit up a small amount. This is common. BREASTFEEDING FREQUENCY AND DURATION Frequent feeding will help you make more milk and can prevent sore nipples and breast engorgement. Breastfeed when you feel the need to reduce the fullness of your breasts or when your baby shows signs of hunger. This is called "breastfeeding on demand." Avoid introducing a pacifier to your baby while you are working to establish breastfeeding (the first 4 6 weeks after your baby is born). After this time you may choose to use a pacifier. Research has shown that pacifier use during the first year of a baby's life decreases the risk of sudden infant death syndrome (SIDS). Allow your baby to feed on each  breast as long as he or she wants. Breastfeed until your baby is finished feeding. When your baby unlatches or falls asleep while feeding from the first breast, offer the second breast. Because newborns are often sleepy in the first few weeks of life, you may need to awaken your baby to get him or her to feed. Breastfeeding times will vary from baby to baby. However, the following rules can serve as a guide to help you ensure that your baby is properly fed:  Newborns (babies 84 weeks of age or younger) may breastfeed every 1 3 hours.  Newborns should not go longer than 3 hours during the day or 5 hours during the night without breastfeeding.  You should breastfeed your baby a minimum of 8 times in a 24-hour period until you begin to introduce solid foods to your baby at around 56 months of age. BREAST MILK PUMPING Pumping and storing breast milk allows you to ensure that your baby is exclusively fed your breast milk, even at times when you are unable to breastfeed. This is especially important if you are going back to work while you are still breastfeeding or when you are not able to be present during feedings. Your lactation consultant can give you guidelines on how long it is safe to store breast milk.  A breast pump is a machine that allows you to pump milk from your breast into a sterile bottle. The pumped breast milk can then be stored in a refrigerator or freezer. Some breast pumps are operated by hand, while others use electricity. Ask your lactation consultant which type will work best for you. Breast pumps can be purchased, but some hospitals and breastfeeding support groups lease breast pumps on a monthly basis. A lactation consultant can teach you how to hand express breast milk, if you prefer not to use a pump.  CARING FOR YOUR BREASTS WHILE YOU BREASTFEED Nipples can become dry, cracked, and sore while breastfeeding. The following recommendations can help keep your breasts moisturized and  healthy:  Avoid using soap on your nipples.   Wear a supportive bra. Although not required, special  nursing bras and tank tops are designed to allow access to your breasts for breastfeeding without taking off your entire bra or top. Avoid wearing underwire style bras or extremely tight bras. °· Air dry your nipples for 3 4 minutes after each feeding.   °· Use only cotton bra pads to absorb leaked breast milk. Leaking of breast milk between feedings is normal.   °· Use lanolin on your nipples after breastfeeding. Lanolin helps to maintain your skin's normal moisture barrier. If you use pure lanolin you do not need to wash it off before feeding your baby again. Pure lanolin is not toxic to your baby. You may also hand express a few drops of breast milk and gently massage that milk into your nipples and allow the milk to air dry. °In the first few weeks after giving birth, some women experience extremely full breasts (engorgement). Engorgement can make your breasts feel heavy, warm, and tender to the touch. Engorgement peaks within 3 5 days after you give birth. The following recommendations can help ease engorgement: °· Completely empty your breasts while breastfeeding or pumping. You may want to start by applying warm, moist heat (in the shower or with warm water-soaked hand towels) just before feeding or pumping. This increases circulation and helps the milk flow. If your baby does not completely empty your breasts while breastfeeding, pump any extra milk after he or she is finished. °· Wear a snug bra (nursing or regular) or tank top for 1 2 days to signal your body to slightly decrease milk production. °· Apply ice packs to your breasts, unless this is too uncomfortable for you. °· Make sure that your baby is latched on and positioned properly while breastfeeding. °If engorgement persists after 48 hours of following these recommendations, contact your health care provider or a lactation consultant. °OVERALL  HEALTH CARE RECOMMENDATIONS WHILE BREASTFEEDING °· Eat healthy foods. Alternate between meals and snacks, eating 3 of each per day. Because what you eat affects your breast milk, some of the foods may make your baby more irritable than usual. Avoid eating these foods if you are sure that they are negatively affecting your baby. °· Drink milk, fruit juice, and water to satisfy your thirst (about 10 glasses a day).   °· Rest often, relax, and continue to take your prenatal vitamins to prevent fatigue, stress, and anemia. °· Continue breast self-awareness checks. °· Avoid chewing and smoking tobacco. °· Avoid alcohol and drug use. °Some medicines that may be harmful to your baby can pass through breast milk. It is important to ask your health care provider before taking any medicine, including all over-the-counter and prescription medicine as well as vitamin and herbal supplements. °It is possible to become pregnant while breastfeeding. If birth control is desired, ask your health care provider about options that will be safe for your baby. °SEEK MEDICAL CARE IF:  °· You feel like you want to stop breastfeeding or have become frustrated with breastfeeding. °· You have painful breasts or nipples. °· Your nipples are cracked or bleeding. °· Your breasts are red, tender, or warm. °· You have a swollen area on either breast. °· You have a fever or chills. °· You have nausea or vomiting. °· You have drainage other than breast milk from your nipples. °· Your breasts do not become full before feedings by the 5th day after you give birth. °· You feel sad and depressed. °· Your baby is too sleepy to eat well. °· Your baby is having trouble sleeping.   °·   Your baby is wetting less than 3 diapers in a 24-hour period.  Your baby has less than 3 stools in a 24-hour period.  Your baby's skin or the white part of his or her eyes becomes yellow.   Your baby is not gaining weight by 715 days of age. SEEK IMMEDIATE MEDICAL CARE  IF:   Your baby is overly tired (lethargic) and does not want to wake up and feed.  Your baby develops an unexplained fever. Document Released: 08/06/2005 Document Revised: 04/08/2013 Document Reviewed: 01/28/2013 [facility] Patient Information 2014 [place].  Postpartum Depression and Baby Blues The postpartum period begins right after the birth of a baby. During this time, there is often a great amount of joy and excitement. It is also a time of considerable changes in the life of the parent(s). Regardless of how many times a mother gives birth, each child brings new challenges and dynamics to the family. It is not unusual to have feelings of excitement accompanied by confusing shifts in moods, emotions, and thoughts. All mothers are at risk of developing postpartum depression or the "baby blues." These mood changes can occur right after giving birth, or they may occur many months after giving birth. The baby blues or postpartum depression can be mild or severe. Additionally, postpartum depression can resolve rather quickly, or it can be a long-term condition. CAUSES Elevated hormones and their rapid decline are thought to be a main cause of postpartum depression and the baby blues. There are a number of hormones that radically change during and after pregnancy. Estrogen and progesterone usually decrease immediately after delivering your baby. The level of thyroid hormone and various cortisol steroids also rapidly drop. Other factors that play a major role in these changes include major life events and genetics.  RISK FACTORS If you have any of the following risks for the baby blues or postpartum depression, know what symptoms to watch out for during the postpartum period. Risk factors that may increase the likelihood of getting the baby blues or postpartum depression include:  Havinga personal or family history of depression.  Having depression while being pregnant.  Having premenstrual  or oral contraceptive-associated mood issues.  Having exceptional life stress.  Having marital conflict.  Lacking a social support network.  Having a baby with special needs.  Having health problems such as diabetes. SYMPTOMS Baby blues symptoms include:  Brief fluctuations in mood, such as going from extreme happiness to sadness.  Decreased concentration.  Difficulty sleeping.  Crying spells, tearfulness.  Irritability.  Anxiety. Postpartum depression symptoms typically begin within the first month after giving birth. These symptoms include:  Difficulty sleeping or excessive sleepiness.  Marked weight loss.  Agitation.  Feelings of worthlessness.  Lack of interest in activity or food. Postpartum psychosis is a very concerning condition and can be dangerous. Fortunately, it is rare. Displaying any of the following symptoms is cause for immediate medical attention. Postpartum psychosis symptoms include:  Hallucinations and delusions.  Bizarre or disorganized behavior.  Confusion or disorientation. DIAGNOSIS  A diagnosis is made by an evaluation of your symptoms. There are no medical or lab tests that lead to a diagnosis, but there are various questionnaires that a caregiver may use to identify those with the baby blues, postpartum depression, or psychosis. Often times, a screening tool called the [place] Postnatal Depression Scale is used to diagnose depression in the postpartum period.  TREATMENT The baby blues usually goes away on its own in 1 to 2 weeks. Social  support is often all that is needed. You should be encouraged to get adequate sleep and rest. Occasionally, you may be given medicines to help you sleep.  Postpartum depression requires treatment as it can last several months or longer if it is not treated. Treatment may include individual or group therapy, medicine, or both to address any social, physiological, and psychological factors that may play a role  in the depression. Regular exercise, a healthy diet, rest, and social support may also be strongly recommended.  Postpartum psychosis is more serious and needs treatment right away. Hospitalization is often needed. HOME CARE INSTRUCTIONS  Get as much rest as you can. Nap when the baby sleeps.  Exercise regularly. Some women find yoga and walking to be beneficial.  Eat a balanced and nourishing diet.  Do little things that you enjoy. Have a cup of tea, take a bubble bath, read your favorite magazine, or listen to your favorite music.  Avoid alcohol.  Ask for help with household chores, cooking, grocery shopping, or running errands as needed. Do not try to do everything.  Talk to people close to you about how you are feeling. Get support from your partner, family members, friends, or other new moms.  Try to stay positive in how you think. Think about the things you are grateful for.  Do not spend a lot of time alone.  Only take medicine as directed by your caregiver.  Keep all your postpartum appointments.  Let your caregiver know if you have any concerns. SEEK MEDICAL CARE IF: You are having a reaction or problems with your medicine. SEEK IMMEDIATE MEDICAL CARE IF:  You have suicidal feelings.  You feel you may harm the baby or someone else. Document Released: 05/10/2004 Document Revised: 10/29/2011 Document Reviewed: 05/18/2013 [facility] Patient Information 2014 [place], [place].

## 2014-03-24 IMAGING — US US MFM OB TRANSVAGINAL
1 series · 13 of 15 positions shown · non-contrast
Comparison: none

[Series 1: us ob follow up addl gest · 15 acquisitions, 13 frames shown]
[im 1/15]
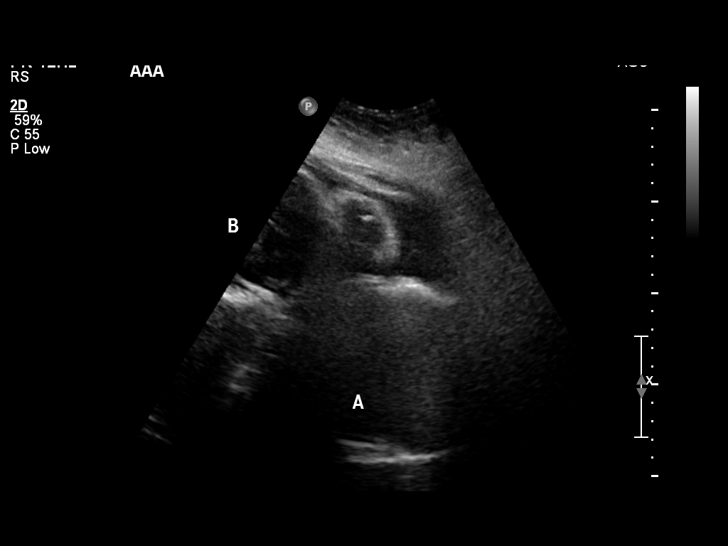
[im 2/15]
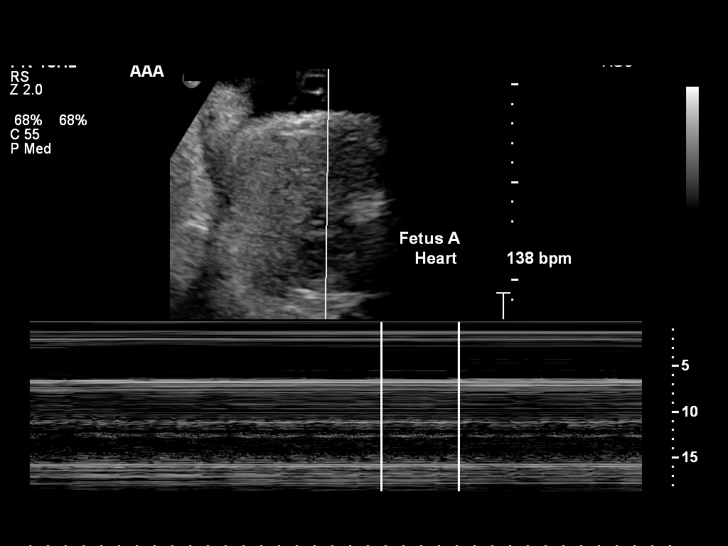
[im 3/15]
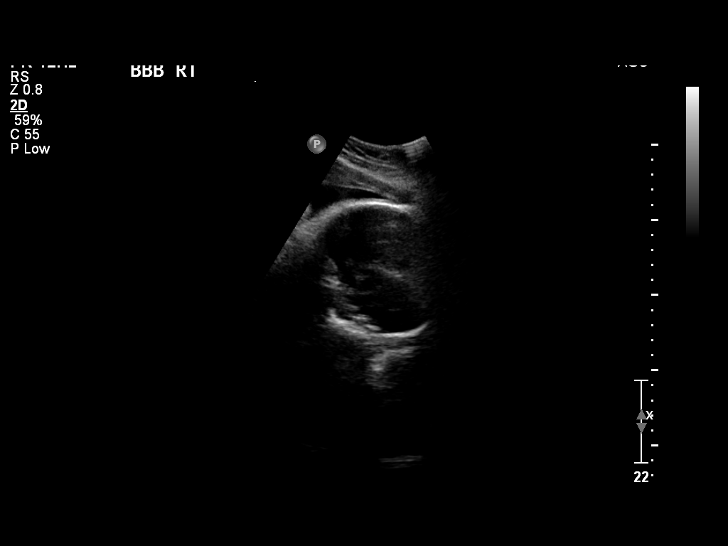
[im 5/15]
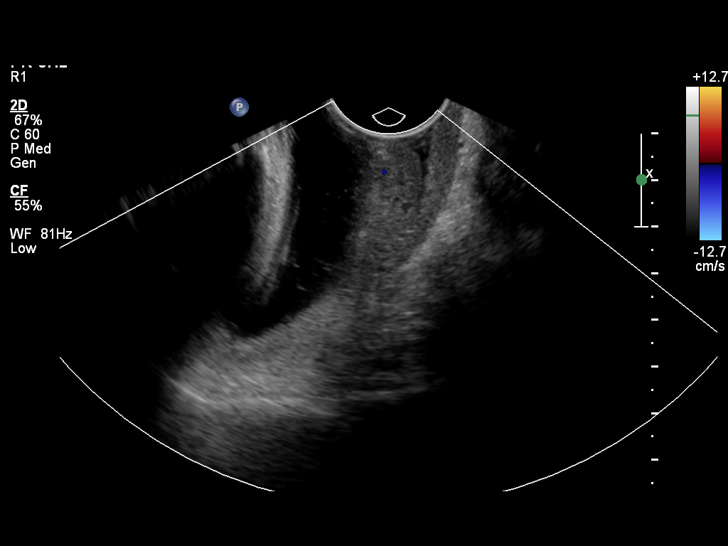
[im 6/15]
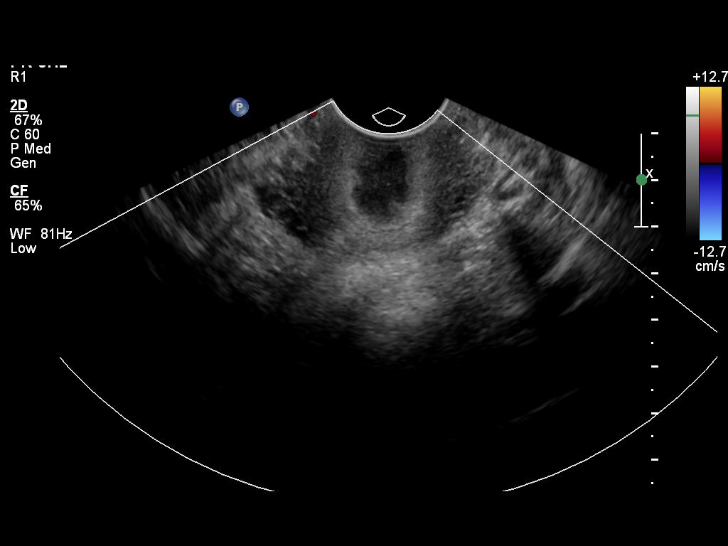
[im 7/15]
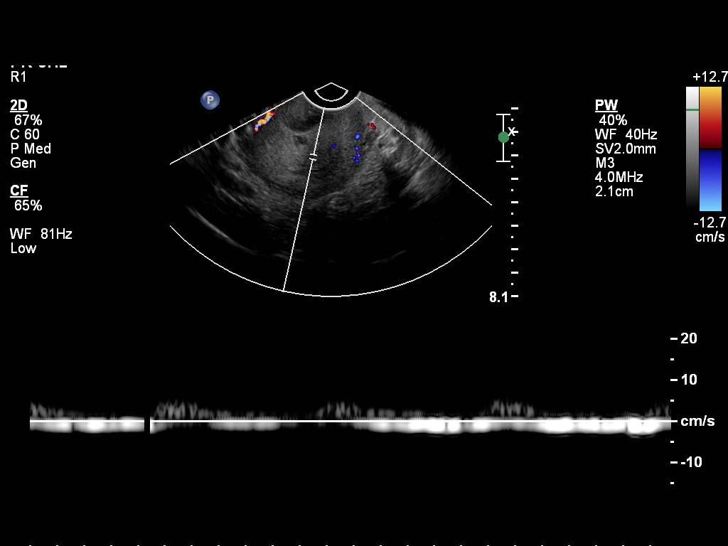
[im 8/15]
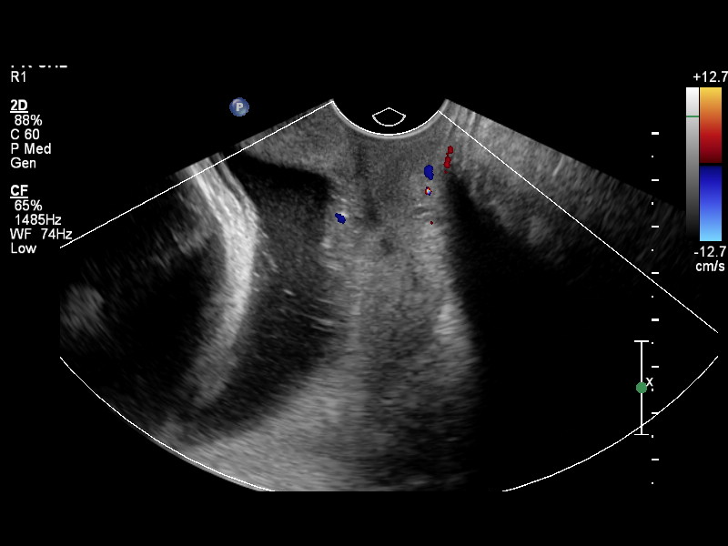
[im 9/15]
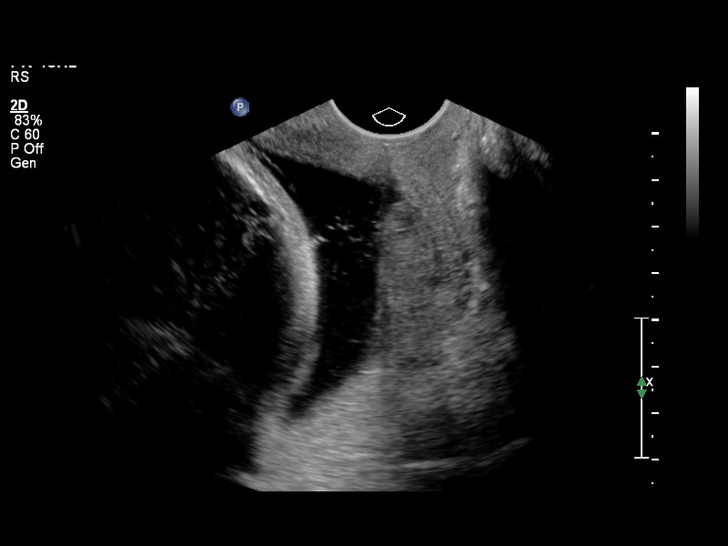
[im 10/15]
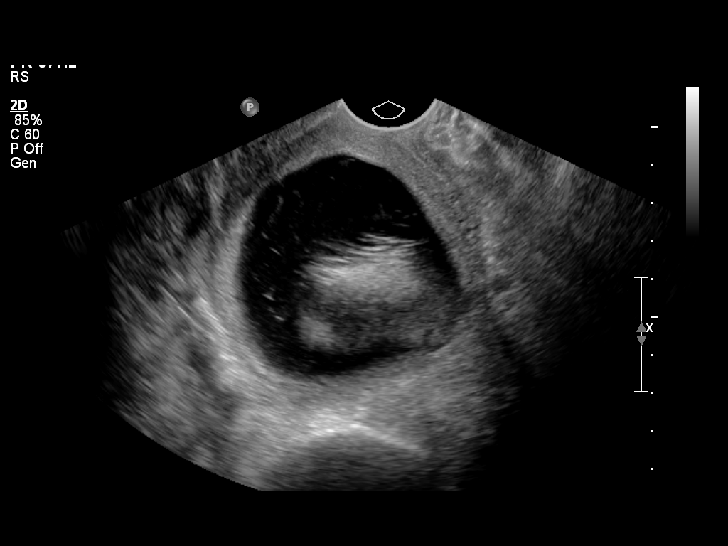
[im 11/15]
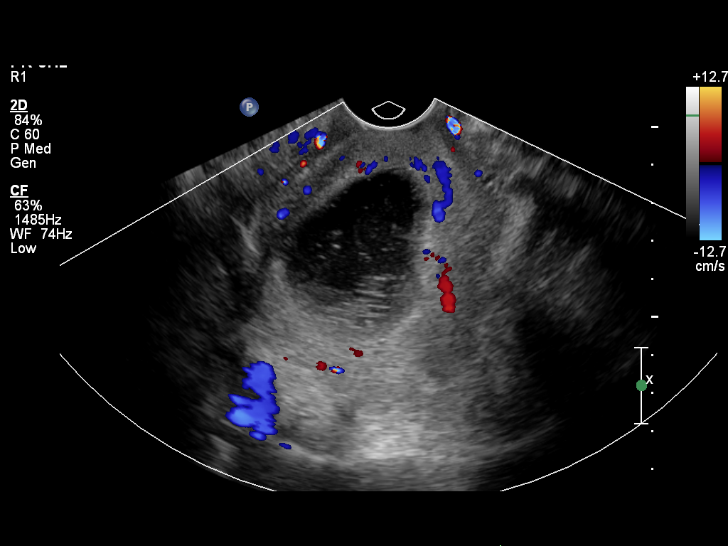
[im 13/15]
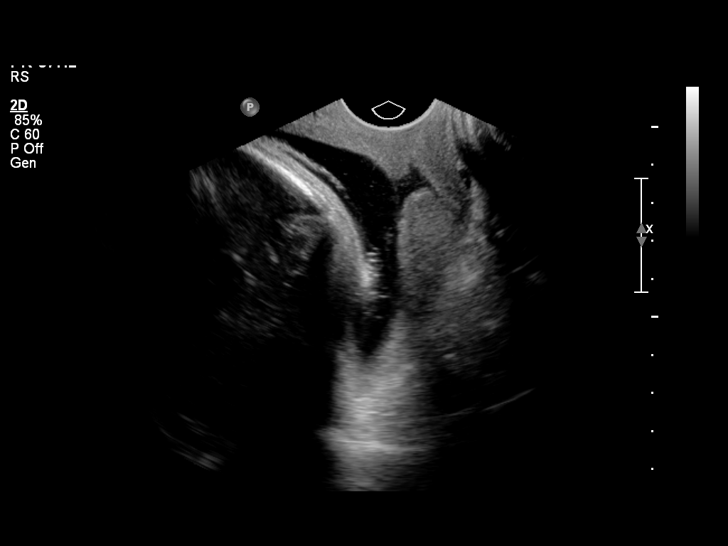
[im 14/15]
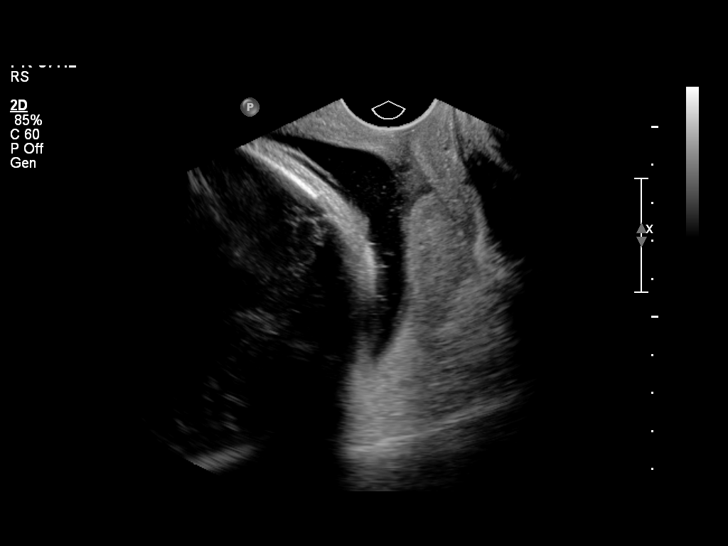
[im 15/15]
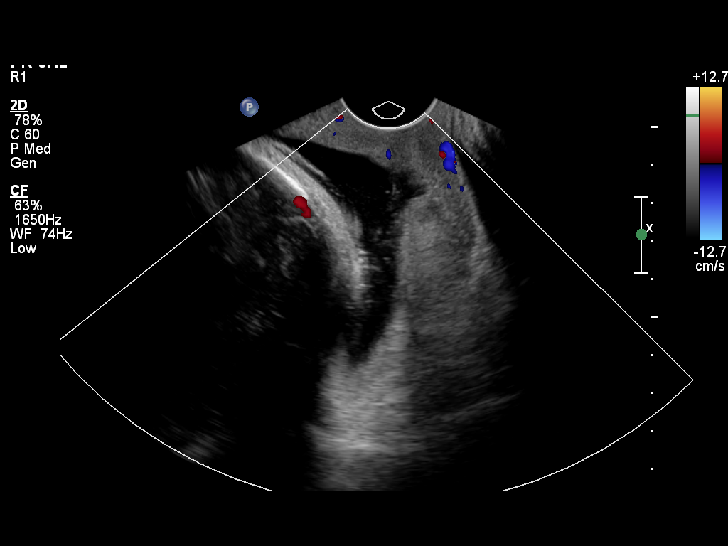

[13 of 15 positions shown; findings below may reference images not displayed]

OBSTETRICS REPORT
                      (Signed Final 09/03/2013 [DATE])

Service(s) Provided

 US MFM OB TRANSVAGINAL                                76817.2
Indications

 Determine fetal presentation using ultrasound
 Twin gestation, Di-Di
Fetal Evaluation (Fetus A)

 Num Of Fetuses:    2
 Fetal Heart Rate:  138                          bpm
 Cardiac Activity:  Observed
 Fetal Lie:         Left Fetus
 Presentation:      Cephalic
Gestational Age (Fetus A)

 Clinical EDD:  37w 2d                                        EDD:   09/22/13
 Best:          37w 2d     Det. By:  Clinical EDD             EDD:   09/22/13

Fetal Evaluation (Fetus B)

 Num Of Fetuses:    2
 Fetal Heart Rate:  139                          bpm
 Cardiac Activity:  Observed
 Fetal Lie:         Right Fetus
 Presentation:      Cephalic
Gestational Age (Fetus B)

 Clinical EDD:  37w 2d                                        EDD:   09/22/13
 Best:          37w 2d     Det. By:  Clinical EDD             EDD:   09/22/13
Impression

 DC/DA twin gestation at 37 [DATE] weeks
 Limited ultrasound performed to rule out placenta previa / cord
 TVUS - no evidence of placenta previa; no fetal vessels
 noted on Color Doppler
Recommendations

 Follow-up ultrasounds as clinically indicated.
 questions or concerns.

## 2014-06-21 ENCOUNTER — Encounter (HOSPITAL_COMMUNITY): Payer: Self-pay | Admitting: *Deleted

## 2014-09-02 ENCOUNTER — Encounter (HOSPITAL_COMMUNITY): Payer: Self-pay | Admitting: Obstetrics and Gynecology

## 2015-01-27 ENCOUNTER — Encounter (HOSPITAL_COMMUNITY): Payer: Self-pay | Admitting: Obstetrics and Gynecology

## 2015-08-25 ENCOUNTER — Other Ambulatory Visit: Payer: Self-pay | Admitting: Obstetrics and Gynecology

## 2015-08-25 NOTE — H&P (Signed)
Elizabeth Remonia RichterM Korn is a 35 y.o. female, G2P102  at 11.3 weeks by LMP of  06/06/15 and Early 6.2wk US, presenting Suction Dilation and Evacuation for threatened miscarraige.  She started cramping with brown tinged spotting on 08/22/14 and Cramping with spottin-dark red blood on 08/23/14. Dark blood/mucous noted in vagina. No fetal heart tones present via doppler and US.  IUP  EGA  8.1 wks.   Pt counseled on management option of threatened miscarriage and opts for surgical management.   Patient Active Problem List   Diagnosis Date Noted  . Twin, delivered by cesarean 09/04/2013  . Gestational hypertension 09/03/2013  . Twin pregnancy, twins dichorionic and diamniotic 09/03/2013  . History of GBS (group B streptococcus) UTI, currently pregnant 09/03/2013  . Infertility, female 01/30/2012  . Irregular bleeding 01/30/2012    History of present pregnancy: Patient entered care at 6 weeks.   EDC of 03/12/16 was established by LMP of 06/06/15.    US evaluations:  6.2 wks  Viability (07/20/2015): Anteverted uterus. Singleton 5668w1d intrauterine pregnancy-yolk sac seen. Ovaries and  adenexas are unremarkable. Right corpus luteum.  FHR 119 bpm 11.2wks Viability (08/24/15): Anteverted uterus. Singleton 8w 1d intrauterine pregnancy- NO cardiac activity. Ovaries  not visualized. adenexas are unremarkable.   Last evaluation:  08/24/15  R.Zenith Lamphier, CNM   FHT absent    SVE: Closed/thick   BP 110/60   156.5 lbs    OB History    Gravida Para Term Preterm AB TAB SAB Ectopic Multiple Living   1 1 1      1 2      Past Medical History  Diagnosis Date  . PCOS (polycystic ovarian syndrome)   . H/O varicella   . Asthma   . Headache(784.0)     Frequent  . H/O bacterial infection   . Yeast infection   . 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 Menses, irregular   . Hx: UTI (urinary tract infection)   . CIN II (cervical intraepithelial neoplasia II) 01/14/03  . CIN III (cervical intraepithelial neoplasia grade III) with severe dysplasia  01/14/03  . Abnormal Pap smear 2000, 2004 & 2005  . H/O female hirsutism    Past Surgical History  Procedure Laterality Date  . No past surgeries    . Cesarean section N/A 09/03/2013    Procedure: CESAREAN SECTION;  Surgeon: Michael LitterNaima A Dillard, MD;  Location: [facility];  Service: Obstetrics;  Laterality: N/A;   Family History: family history includes Hypertension in her father; Thyroid disease in her mother. Social History:  reports that she quit smoking about 3 years ago. Her smoking use included Cigarettes. She does not have any smokeless tobacco history on file. She reports that she does not drink alcohol or use illicit drugs.   ROS:  Cramping, vaginal bleeding.   Allergies  Allergen Reactions  . Doxycycline Shortness Of Breath       unknown if currently breastfeeding.  Chest clear Heart RRR without murmur Abd gravid, NT, Pelvic: Closed/thick  Ext: wnl   Prenatal labs: ABO, Rh:     B, postive Antibody:      negative Rubella:  !Error!    Not immune RPR:     Not reactive  07/11/15 HBsAg:    Negative  07/11/15 HIV:     Not reactive  07/11/15 GBS:    NA Sickle cell/Hgb electrophoresis:  NA Pap:  unknown GC:  Negative Chlamydia:  Negative Genetic screenings:  N/A Glucola:  N/A Other:   Hgb 13.3  NOB,  Assessment/Plan: IUP noted to be  8.[redacted]wks  EGA on [place] No Fetal Heart tones Threatened misscarriage - cramping & bleeding   Plan: Scheduled Suction  Dilation and Exacuation on 08/26/15 with Dr. Stefano Gaul Routine [facility] orders  Beatrix Fetters, [place] 08/25/2015, 4:21 PM

## 2015-08-26 ENCOUNTER — Ambulatory Visit (HOSPITAL_COMMUNITY): Payer: 59 | Admitting: Anesthesiology

## 2015-08-26 ENCOUNTER — Ambulatory Visit (HOSPITAL_COMMUNITY)
Admission: RE | Admit: 2015-08-26 | Discharge: 2015-08-26 | Disposition: A | Discharge status: Home / Self Care | Payer: 59 | Source: Ambulatory Visit | Attending: Obstetrics and Gynecology | Admitting: Obstetrics and Gynecology

## 2015-08-26 ENCOUNTER — Encounter (HOSPITAL_COMMUNITY)
Admission: RE | Disposition: A | Discharge status: Home / Self Care | Payer: Self-pay | Source: Ambulatory Visit | Attending: Obstetrics and Gynecology

## 2015-08-26 DIAGNOSIS — O021 Missed abortion: Secondary | ICD-10-CM | POA: Diagnosis present

## 2015-08-26 HISTORY — PX: DILATION AND EVACUATION: SHX1459

## 2015-08-26 LAB — CBC
HEMATOCRIT: 38.8 % (ref 36.0–46.0)
Hemoglobin: 13.2 g/dL (ref 12.0–15.0)
MCH: 30.1 pg (ref 26.0–34.0)
MCHC: 34 g/dL (ref 30.0–36.0)
MCV: 88.4 fL (ref 78.0–100.0)
Platelets: 280 10*3/uL (ref 150–400)
RBC: 4.39 MIL/uL (ref 3.87–5.11)
RDW: 13.7 % (ref 11.5–15.5)
WBC: 15.9 10*3/uL — ABNORMAL HIGH (ref 4.0–10.5)

## 2015-08-26 SURGERY — DILATION AND EVACUATION, UTERUS
Anesthesia: General

## 2015-08-26 MED ORDER — BUPIVACAINE-EPINEPHRINE 0.5% -1:200000 IJ SOLN
INTRAMUSCULAR | Status: DC | PRN
  Administered 2015-08-26: 10 mL

## 2015-08-26 MED ORDER — GLYCOPYRROLATE 0.2 MG/ML IJ SOLN
INTRAMUSCULAR | Status: AC
  Filled 2015-08-26: qty 1

## 2015-08-26 MED ORDER — HYDROMORPHONE HCL 1 MG/ML IJ SOLN: 0.2500 mg | INTRAMUSCULAR | Status: DC | PRN

## 2015-08-26 MED ORDER — IBUPROFEN 800 MG PO TABS: 800.0000 mg | ORAL_TABLET | Freq: Three times a day (TID) | ORAL | Status: DC | PRN

## 2015-08-26 MED ORDER — DEXAMETHASONE SODIUM PHOSPHATE 10 MG/ML IJ SOLN
INTRAMUSCULAR | Status: AC
  Filled 2015-08-26: qty 1

## 2015-08-26 MED ORDER — OXYCODONE HCL 5 MG PO TABS: 5.0000 mg | ORAL_TABLET | Freq: Once | ORAL | Status: DC | PRN

## 2015-08-26 MED ORDER — KETOROLAC TROMETHAMINE 30 MG/ML IJ SOLN
INTRAMUSCULAR | Status: AC
  Filled 2015-08-26: qty 1

## 2015-08-26 MED ORDER — PROPOFOL 10 MG/ML IV BOLUS
INTRAVENOUS | Status: DC | PRN
  Administered 2015-08-26 (×3): 50 mg via INTRAVENOUS
  Administered 2015-08-26: 100 mg via INTRAVENOUS

## 2015-08-26 MED ORDER — OXYCODONE HCL 5 MG/5ML PO SOLN: 5.0000 mg | Freq: Once | ORAL | Status: DC | PRN

## 2015-08-26 MED ORDER — DEXAMETHASONE SODIUM PHOSPHATE 10 MG/ML IJ SOLN
INTRAMUSCULAR | Status: DC | PRN
  Administered 2015-08-26: 4 mg via INTRAVENOUS

## 2015-08-26 MED ORDER — MEPERIDINE HCL 25 MG/ML IJ SOLN: 6.2500 mg | INTRAMUSCULAR | Status: DC | PRN

## 2015-08-26 MED ORDER — ONDANSETRON HCL 4 MG/2ML IJ SOLN
INTRAMUSCULAR | Status: DC | PRN
  Administered 2015-08-26: 4 mg via INTRAVENOUS

## 2015-08-26 MED ORDER — KETOROLAC TROMETHAMINE 30 MG/ML IJ SOLN
INTRAMUSCULAR | Status: DC | PRN
  Administered 2015-08-26: 30 mg via INTRAVENOUS
  Administered 2015-08-26: 30 mg via INTRAMUSCULAR

## 2015-08-26 MED ORDER — PROPOFOL 10 MG/ML IV BOLUS
INTRAVENOUS | Status: AC
  Filled 2015-08-26: qty 20

## 2015-08-26 MED ORDER — LIDOCAINE HCL (CARDIAC) 20 MG/ML IV SOLN
INTRAVENOUS | Status: AC
  Filled 2015-08-26: qty 5

## 2015-08-26 MED ORDER — MIDAZOLAM HCL 2 MG/2ML IJ SOLN
INTRAMUSCULAR | Status: AC
  Filled 2015-08-26: qty 2

## 2015-08-26 MED ORDER — LACTATED RINGERS IV SOLN
INTRAVENOUS | Status: DC
  Administered 2015-08-26: 13:00:00 via INTRAVENOUS

## 2015-08-26 MED ORDER — FENTANYL CITRATE (PF) 100 MCG/2ML IJ SOLN
INTRAMUSCULAR | Status: AC
  Filled 2015-08-26: qty 2

## 2015-08-26 MED ORDER — ONDANSETRON HCL 4 MG/2ML IJ SOLN
INTRAMUSCULAR | Status: AC
  Filled 2015-08-26: qty 2

## 2015-08-26 MED ORDER — MIDAZOLAM HCL 2 MG/2ML IJ SOLN
INTRAMUSCULAR | Status: DC | PRN
  Administered 2015-08-26: 2 mg via INTRAVENOUS

## 2015-08-26 MED ORDER — SCOPOLAMINE 1 MG/3DAYS TD PT72
MEDICATED_PATCH | TRANSDERMAL | Status: AC
  Administered 2015-08-26: 1.5 mg via TRANSDERMAL
  Filled 2015-08-26: qty 1

## 2015-08-26 MED ORDER — BUPIVACAINE-EPINEPHRINE (PF) 0.5% -1:200000 IJ SOLN
INTRAMUSCULAR | Status: AC
  Filled 2015-08-26: qty 30

## 2015-08-26 MED ORDER — FENTANYL CITRATE (PF) 100 MCG/2ML IJ SOLN
INTRAMUSCULAR | Status: DC | PRN
  Administered 2015-08-26: 100 ug via INTRAVENOUS

## 2015-08-26 MED ORDER — OXYCODONE-ACETAMINOPHEN 5-325 MG PO TABS: 1.0000 | ORAL_TABLET | ORAL | Status: DC | PRN

## 2015-08-26 MED ORDER — LIDOCAINE HCL (CARDIAC) 20 MG/ML IV SOLN
INTRAVENOUS | Status: DC | PRN
  Administered 2015-08-26: 80 mg via INTRAVENOUS

## 2015-08-26 MED ORDER — SCOPOLAMINE 1 MG/3DAYS TD PT72
1.0000 | MEDICATED_PATCH | Freq: Once | TRANSDERMAL | Status: DC
  Administered 2015-08-26: 1.5 mg via TRANSDERMAL

## 2015-08-26 SURGICAL SUPPLY — 20 items
CATH ROBINSON RED A/P 16FR (CATHETERS) ×3 IMPLANT
CLOTH BEACON ORANGE TIMEOUT ST (SAFETY) ×3 IMPLANT
DECANTER SPIKE VIAL GLASS SM (MISCELLANEOUS) ×3 IMPLANT
GLOVE BIOGEL PI IND STRL 7.0 (GLOVE) ×1 IMPLANT
GLOVE BIOGEL PI IND STRL 8.5 (GLOVE) ×1 IMPLANT
GLOVE BIOGEL PI INDICATOR 7.0 (GLOVE) ×2
GLOVE BIOGEL PI INDICATOR 8.5 (GLOVE) ×2
GLOVE ECLIPSE 8.0 STRL XLNG CF (GLOVE) ×6 IMPLANT
GOWN STRL REUS W/TWL LRG LVL3 (GOWN DISPOSABLE) ×6 IMPLANT
KIT BERKELEY 1ST TRIMESTER 3/8 (MISCELLANEOUS) ×3 IMPLANT
NS IRRIG 1000ML POUR BTL (IV SOLUTION) ×3 IMPLANT
PACK VAGINAL MINOR WOMEN LF (CUSTOM PROCEDURE TRAY) ×3 IMPLANT
PAD OB MATERNITY 4.3X12.25 (PERSONAL CARE ITEMS) ×3 IMPLANT
PAD PREP 24X48 CUFFED NSTRL (MISCELLANEOUS) ×3 IMPLANT
SET BERKELEY SUCTION TUBING (SUCTIONS) ×3 IMPLANT
TOWEL OR 17X24 6PK STRL BLUE (TOWEL DISPOSABLE) ×6 IMPLANT
VACURETTE 10 RIGID CVD (CANNULA) IMPLANT
VACURETTE 7MM CVD STRL WRAP (CANNULA) IMPLANT
VACURETTE 8 RIGID CVD (CANNULA) ×2 IMPLANT
VACURETTE 9 RIGID CVD (CANNULA) IMPLANT

## 2015-08-26 NOTE — Op Note (Signed)
OPERATIVE NOTE  Elizabeth SellsDawn M Melody  DOB:    [birth year]  MRN:    [id number]  CSN:    [id number]  Date of Surgery:  08/26/2015  Preoperative Diagnosis:  First trimester Missed Abortion  Postoperative Diagnosis:  First trimester Missed Abortion  Procedure:  FIRST TRIMESTER SUCTION DILATATION AND CURETTAGE  Surgeon:  Leonard SchwartzArthur Vernon Chuck Caban, M.D.  Assistant:  None  Anesthetic:  MAC Paracervical block using half percent Marcaine with epinephrine 1/200,000  Disposition:  The patient is a 35 y.o. year old female who presents with a nonviable gestation based on ultrasound and/or quantitative hCG values. She understands the indications for her surgical procedure as well as the alternative treatment options. She accepts the risk of, but not limited to, anesthetic complications, bleeding, infections, and possible damage to the surrounding organs.  Findings:  The patient's blood type is B+. A moderate amount of products of conception were removed from within a 10 weeks size uterus. No adnexal masses were appreciated.  Procedure:  The patient was taken to the operating room where a Choice anesthetic was given. The patient's abdomen, perineum, and vagina were prepped with Betadine. The bladder was drained of urine. The patient was sterilely draped. Examination under anesthesia was performed. A paracervical block was placed using 10 cc of half percent Marcaine with epinephrine. The uterus sounded to 9 centimeters. The cervix was gently dilated. The uterine cavity was evacuated using a size 8 suction curet. The cavity was then cleaned using a medium sharp curet. The cavity was felt to be clean at the end of our procedure. The estimated blood loss was 30 cc. The uterus was reexamined and was noted to be firm. Hemostasis was adequate. Sponge and needle counts were correct. The patient tolerated her but her procedure well. She was awakened from her anesthetic without difficulty. She was transported  to the recovery room in stable condition. The products of conception were sent to pathology.  Followup instructions:  The patient return to see Dr. Stefano GaulStringer in 2 weeks. She was given prescriptions for pain medications. She was given instructions for patients who've undergone the surgical procedure. She will call for questions or concerns.  Leonard SchwartzArthur Vernon Cainen Burnham, M.D.  08/26/2015

## 2015-08-26 NOTE — Anesthesia Postprocedure Evaluation (Signed)
Anesthesia Post Note  Patient: Elizabeth Adams  Procedure(s) Performed: Procedure(s) (LRB): Suction DILATATION AND EVACUATION (N/A)  Patient location during evaluation: PACU Anesthesia Type: MAC Level of consciousness: awake and alert Pain management: pain level controlled Vital Signs Assessment: post-procedure vital signs reviewed and stable Respiratory status: spontaneous breathing, nonlabored ventilation, respiratory function stable and patient connected to nasal cannula oxygen Cardiovascular status: blood pressure returned to baseline and stable Postop Assessment: no signs of nausea or vomiting Anesthetic complications: no    Last Vitals:  Filed Vitals:   08/26/15 1530 08/26/15 1544  BP:    Pulse: 84 82  Temp:  37 C  Resp: 22 20    Last Pain:  Filed Vitals:   08/26/15 1550  PainSc: 2                  Saydi Kobel EDWARD

## 2015-08-26 NOTE — Anesthesia Preprocedure Evaluation (Signed)
Anesthesia Evaluation  Patient identified by MRN, date of birth, ID band Patient awake    Reviewed: Allergy & Precautions, NPO status , Patient's Chart, lab work & pertinent test results  Airway Mallampati: I  TM Distance: >3 FB Neck ROM: Full    Dental  (+) Teeth Intact, Dental Advisory Given   Pulmonary asthma , former smoker,    breath sounds clear to auscultation       Cardiovascular hypertension,  Rhythm:Regular Rate:Normal     Neuro/Psych    GI/Hepatic   Endo/Other    Renal/GU      Musculoskeletal   Abdominal   Peds  Hematology   Anesthesia Other Findings   Reproductive/Obstetrics                             Anesthesia Physical Anesthesia Plan  ASA: II  Anesthesia Plan: General   Post-op Pain Management:    Induction: Intravenous  Airway Management Planned: LMA  Additional Equipment:   Intra-op Plan:   Post-operative Plan: Extubation in OR  Informed Consent: I have reviewed the patients History and Physical, chart, labs and discussed the procedure including the risks, benefits and alternatives for the proposed anesthesia with the patient or authorized representative who has indicated his/her understanding and acceptance.   Dental advisory given  Plan Discussed with: CRNA, Anesthesiologist and Surgeon  Anesthesia Plan Comments:         Anesthesia Quick Evaluation

## 2015-08-26 NOTE — Discharge Instructions (Signed)
Dilation and Curettage or Vacuum Curettage Dilation and curettage (D&C) and vacuum curettage are minor procedures. A D&C involves stretching (dilation) the cervix and scraping (curettage) the inside lining of the womb (uterus). During a D&C, tissue is gently scraped from the inside lining of the uterus. During a vacuum curettage, the lining and tissue in the uterus are removed with the use of gentle suction.  Curettage may be performed to either diagnose or treat a problem. As a diagnostic procedure, curettage is performed to examine tissues from the uterus. A diagnostic curettage may be performed for the following symptoms:   Irregular bleeding in the uterus.   Bleeding with the development of clots.   Spotting between menstrual periods.   Prolonged menstrual periods.   Bleeding after menopause.   No menstrual period (amenorrhea).   A change in size and shape of the uterus.  As a treatment procedure, curettage may be performed for the following reasons:   Removal of an IUD (intrauterine device).   Removal of retained placenta after giving birth. Retained placenta can cause an infection or bleeding severe enough to require transfusions.   Abortion.   Miscarriage.   Removal of polyps inside the uterus.   Removal of uncommon types of noncancerous lumps (fibroids).  LET [facility] CARE PROVIDER KNOW ABOUT:   Any allergies you have.   All medicines you are taking, including vitamins, herbs, eye drops, creams, and over-the-counter medicines.   Previous problems you or members of your family have had with the use of anesthetics.   Any blood disorders you have.   Previous surgeries you have had.   Medical conditions you have. RISKS AND COMPLICATIONS  Generally, this is a safe procedure. However, as with any procedure, complications can occur. Possible complications include:  Excessive bleeding.   Infection of the uterus.   Damage to the cervix.    Development of scar tissue (adhesions) inside the uterus, later causing abnormal amounts of menstrual bleeding.   Complications from the general anesthetic, if a general anesthetic is used.   Putting a hole (perforation) in the uterus. This is rare.  BEFORE THE PROCEDURE   Eat and drink before the procedure only as directed by your health care provider.   Arrange for someone to take you home.  PROCEDURE  This procedure usually takes about 15-30 minutes.  You will be given one of the following:  A medicine that numbs the area in and around the cervix (local anesthetic).   A medicine to make you sleep through the procedure (general anesthetic).  You will lie on your back with your legs in stirrups.   A warm metal or plastic instrument (speculum) will be placed in your vagina to keep it open and to allow the health care provider to see the cervix.  There are two ways in which your cervix can be softened and dilated. These include:   Taking a medicine.   Having thin rods (laminaria) inserted into your cervix.   A curved tool (curette) will be used to scrape cells from the inside lining of the uterus. In some cases, gentle suction is applied with the curette. The curette will then be removed.  AFTER THE PROCEDURE   You will rest in the recovery area until you are stable and are ready to go home.   You may feel sick to your stomach (nauseous) or throw up (vomit) if you were given a general anesthetic.   You may have a sore throat if a tube  was placed in your throat during general anesthesia.   You may have light cramping and bleeding. This may last for 2 days to 2 weeks after the procedure.   Your uterus needs to make a new lining after the procedure. This may make your next period late.   This information is not intended to replace advice given to you by your health care provider. Make sure you discuss any questions you have with your health care provider.    Document Released: 08/06/2005 Document Revised: 04/08/2013 Document Reviewed: 03/05/2013 Elsevier Interactive Patient Education 2016 [employer]   DISCHARGE INSTRUCTIONS: D&C / D&E The following instructions have been prepared to help you care for yourself upon your return home.   Personal hygiene:  Use sanitary pads for vaginal drainage, not tampons.  Shower the day after your procedure.  NO tub baths, pools or Jacuzzis for 2-3 weeks.  Wipe front to back after using the bathroom.  Activity and limitations:  Do NOT drive or operate any equipment for 24 hours. The effects of anesthesia are still present and drowsiness may result.  Do NOT rest in bed all day.  Walking is encouraged.  Walk up and down stairs slowly.  You may resume your normal activity in one to two days or as indicated by your physician.  Sexual activity: NO intercourse for at least 2 weeks after the procedure, or as indicated by your physician.  Diet: Eat a light meal as desired this evening. You may resume your usual diet tomorrow.  Return to work: You may resume your work activities in one to two days or as indicated by your doctor.  What to expect after your surgery: Expect to have vaginal bleeding/discharge for 2-3 days and spotting for up to 10 days. It is not unusual to have soreness for up to 1-2 weeks. You may have a slight burning sensation when you urinate for the first day. Mild cramps may continue for a couple of days. You may have a regular period in 2-6 weeks.  NO IBUPROFEN PRODUCTS (MOTRIN, ADVIL) OR ALEVE UNTIL 8:45PM TODAY.  SCOPE PATCH MAY BE REMOVED ON OR BEFORE 08/29/15.   Call your doctor for any of the following:  Excessive vaginal bleeding, saturating and changing one pad every hour.  Inability to urinate 6 hours after discharge from hospital.  Pain not relieved by pain medication.  Fever of 100.4 F or greater.  Unusual vaginal discharge or odor.   Call for an  appointment:    Patients signature: ______________________  Nurses signature ________________________  Support person's signature_______________________

## 2015-08-26 NOTE — Transfer of Care (Signed)
Immediate Anesthesia Transfer of Care Note  Patient: Elizabeth Adams  Procedure(s) Performed: Procedure(s): Suction DILATATION AND EVACUATION (N/A)  Patient Location: PACU  Anesthesia Type:MAC  Level of Consciousness: awake, alert  and oriented  Airway & Oxygen Therapy: Patient Spontanous Breathing and Patient connected to nasal cannula oxygen  Post-op Assessment: Report given to RN and Post -op Vital signs reviewed and stable  Post vital signs: Reviewed and stable  Last Vitals:  Filed Vitals:   08/26/15 1252  BP: 93/67  Pulse: 94  Temp: 36.3 C  Resp: 20    Complications: No apparent anesthesia complications

## 2015-08-26 NOTE — Progress Notes (Signed)
The patient was interviewed and examined today.  The previously documented history and physical examination was reviewed. There are no changes. The operative procedure was reviewed. The risks and benefits were outlined again. The specific risks include, but are not limited to, anesthetic complications, bleeding, infections, and possible damage to the surrounding organs. The patient's questions were answered.  We are ready to proceed as outlined. The likelihood of the patient achieving the goals of this procedure is very likely.   Elizabeth Adams, M.D.  

## 2015-08-29 ENCOUNTER — Encounter (HOSPITAL_COMMUNITY): Payer: Self-pay | Admitting: Obstetrics and Gynecology

## 2016-10-09 ENCOUNTER — Other Ambulatory Visit (HOSPITAL_COMMUNITY): Payer: Self-pay | Admitting: Obstetrics and Gynecology

## 2016-10-09 ENCOUNTER — Ambulatory Visit (HOSPITAL_COMMUNITY)
Admission: RE | Admit: 2016-10-09 | Discharge: 2016-10-09 | Disposition: A | Discharge status: Home / Self Care | Payer: 59 | Source: Ambulatory Visit | Attending: Obstetrics and Gynecology | Admitting: Obstetrics and Gynecology

## 2016-10-09 DIAGNOSIS — O283 Abnormal ultrasonic finding on antenatal screening of mother: Secondary | ICD-10-CM | POA: Diagnosis not present

## 2016-10-09 DIAGNOSIS — Z3A08 8 weeks gestation of pregnancy: Secondary | ICD-10-CM | POA: Diagnosis not present

## 2016-10-09 DIAGNOSIS — O3680X0 Pregnancy with inconclusive fetal viability, not applicable or unspecified: Secondary | ICD-10-CM

## 2016-10-10 ENCOUNTER — Other Ambulatory Visit: Payer: Self-pay | Admitting: Obstetrics and Gynecology

## 2016-10-10 ENCOUNTER — Encounter (HOSPITAL_COMMUNITY): Payer: Self-pay

## 2016-10-16 NOTE — H&P (Signed)
Elizabeth Adams is a 36 y.o. female, [phone] presenting for scheduled D&E due to failed pregnancy at 8 4/[redacted] weeks gestation.  Blood type is B+.  Has been on Metformin due to PCOS and micronized progesterone due to previous miscarriage.  QHCG 10/02/16 = [id number].  US with IUGS, questionable [facility]S, [id number]   US with single, irregular GS, probable abnormal US, no embryo, findings c/w failed pregnancy.  MSD = 8 3/7 weeks.  Ovaries WNL, no FF.  Patient desired to proceed with D&E for management.  Labs on 10/02/16:  Vit D 22--on supplementation Hgb A1C 5.3  Patient Active Problem List   Diagnosis Date Noted  . Previous cesarean section 10/17/2016  . History of gestational hypertension 10/17/2016  . Vitamin D deficiency 10/17/2016  . Acid reflux 10/17/2016  . PCOS (polycystic ovarian syndrome) 10/17/2016  . 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 Miscarriage dx 10/09/16 10/17/2016  . Infertility, female 01/30/2012  . HSV-2 hx 04/29/2002    OB History    Gravida Para Term Preterm AB Living   3 1 1   1 2    SAB TAB Ectopic Multiple Live Births   1     1 2     2015--Primary LT[facility], 37 weeks, twins, induction for PIH, patient requested C/S during process.  Female x 2, 6+11 amd 6+7, [facility]/Dr. Dillard 08/2015--SAB, D&C  Past Medical History:  Diagnosis Date  . Abnormal Pap smear 2000, 2004 & 2005  . Asthma   . CIN II (cervical intraepithelial neoplasia II) 01/14/03  . CIN III (cervical intraepithelial neoplasia grade III) with severe dysplasia 01/14/03  . GERD (gastroesophageal reflux disease)   . H/O bacterial infection   . H/O female hirsutism   . H/O varicella   . Headache(784.0)    Migraines  . 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 Hx: UTI (urinary tract infection)   . Hypertension    with labor and delivery  . Menses, irregular   . PCOS (polycystic ovarian syndrome)   . Yeast infection    Past Surgical History:  Procedure Laterality Date  . CESAREAN SECTION N/A  09/03/2013   Procedure: CESAREAN SECTION;  Surgeon: Michael LitterNaima A Dillard, MD;  Location: [facility];  Service: Obstetrics;  Laterality: N/A;  . DILATION AND EVACUATION N/A 08/26/2015   Procedure: Suction DILATATION AND EVACUATION;  Surgeon: Kirkland HunArthur Stringer, MD;  Location: [facility];  Service: Gynecology;  Laterality: N/A;  . NO PAST SURGERIES     Family History: family history includes Hypertension in her father; Thyroid disease in her mother. Heart dx in [facility]; Diabetes [facility]; Lung Ca PGM; TB mother and MGM.  Social History:  reports that she quit smoking about 4 years ago. Her smoking use included Cigarettes. She has never used smokeless tobacco. She reports that she drinks alcohol. She reports that she does not use drugs.  She is HS educated, Caucasian, of the [place] faith, married to [place] Mead[place]othy, who is involved and supportive.  ROS:  Denies bleeding, SOB, chest pain, abdominal pain, or any other system issues.  Allergies  Allergen Reactions  . Doxycycline Shortness Of Breath, Swelling and Rash    Throat swelling       Last menstrual period 08/10/2016.  Chest clear Heart RRR without murmur Abd soft, NT. Pelvic: Deferred today Ext: WNL   Assessment/Plan: Failed pregnancy--desires D&E Hx infertility/recurrent SAB PCOS  [facility] Dr. Su Hiltoberts. Routine [facility] pre-op orders Support to patient for loss.  Nigel Bridgeman CNM, [place] 10/17/2016, 7:31 AM

## 2016-10-17 ENCOUNTER — Encounter (HOSPITAL_COMMUNITY)
Admission: RE | Disposition: A | Discharge status: Home / Self Care | Payer: Self-pay | Source: Ambulatory Visit | Attending: Obstetrics and Gynecology

## 2016-10-17 ENCOUNTER — Ambulatory Visit (HOSPITAL_COMMUNITY): Payer: 59 | Admitting: Anesthesiology

## 2016-10-17 ENCOUNTER — Encounter (HOSPITAL_COMMUNITY): Payer: Self-pay | Admitting: Obstetrics and Gynecology

## 2016-10-17 ENCOUNTER — Ambulatory Visit (HOSPITAL_COMMUNITY)
Admission: RE | Admit: 2016-10-17 | Discharge: 2016-10-17 | Disposition: A | Discharge status: Home / Self Care | Payer: 59 | Source: Ambulatory Visit | Attending: Obstetrics and Gynecology | Admitting: Obstetrics and Gynecology

## 2016-10-17 DIAGNOSIS — E282 Polycystic ovarian syndrome: Secondary | ICD-10-CM | POA: Diagnosis not present

## 2016-10-17 DIAGNOSIS — Z8759 Personal history of other complications of pregnancy, childbirth and the puerperium: Secondary | ICD-10-CM

## 2016-10-17 DIAGNOSIS — Z87891 Personal history of nicotine dependence: Secondary | ICD-10-CM | POA: Diagnosis not present

## 2016-10-17 DIAGNOSIS — N96 Recurrent pregnancy loss: Secondary | ICD-10-CM

## 2016-10-17 DIAGNOSIS — O039 Complete or unspecified spontaneous abortion without complication: Secondary | ICD-10-CM

## 2016-10-17 DIAGNOSIS — Z98891 History of uterine scar from previous surgery: Secondary | ICD-10-CM

## 2016-10-17 DIAGNOSIS — K219 Gastro-esophageal reflux disease without esophagitis: Secondary | ICD-10-CM

## 2016-10-17 DIAGNOSIS — E559 Vitamin D deficiency, unspecified: Secondary | ICD-10-CM

## 2016-10-17 DIAGNOSIS — B009 Herpesviral infection, unspecified: Secondary | ICD-10-CM | POA: Diagnosis not present

## 2016-10-17 HISTORY — DX: Essential (primary) hypertension: I10

## 2016-10-17 HISTORY — DX: Gastro-esophageal reflux disease without esophagitis: K21.9

## 2016-10-17 HISTORY — PX: DILATION AND EVACUATION: SHX1459

## 2016-10-17 LAB — CBC
HCT: 39.3 % (ref 36.0–46.0)
HEMOGLOBIN: 13.9 g/dL (ref 12.0–15.0)
MCH: 30.6 pg (ref 26.0–34.0)
MCHC: 35.4 g/dL (ref 30.0–36.0)
MCV: 86.6 fL (ref 78.0–100.0)
PLATELETS: 278 10*3/uL (ref 150–400)
RBC: 4.54 MIL/uL (ref 3.87–5.11)
RDW: 13.6 % (ref 11.5–15.5)
WBC: 11.1 10*3/uL — AB (ref 4.0–10.5)

## 2016-10-17 SURGERY — DILATION AND EVACUATION, UTERUS
Anesthesia: Monitor Anesthesia Care | Site: Vagina

## 2016-10-17 MED ORDER — OXYCODONE-ACETAMINOPHEN 5-325 MG PO TABS: 1.0000 | ORAL_TABLET | Freq: Four times a day (QID) | ORAL | 0 refills | Status: AC | PRN

## 2016-10-17 MED ORDER — KETOROLAC TROMETHAMINE 30 MG/ML IJ SOLN
INTRAMUSCULAR | Status: AC
  Filled 2016-10-17: qty 1

## 2016-10-17 MED ORDER — DEXAMETHASONE SODIUM PHOSPHATE 4 MG/ML IJ SOLN
INTRAMUSCULAR | Status: AC
  Filled 2016-10-17: qty 1

## 2016-10-17 MED ORDER — KETOROLAC TROMETHAMINE 30 MG/ML IJ SOLN
INTRAMUSCULAR | Status: DC | PRN
  Administered 2016-10-17: 30 mg via INTRAVENOUS

## 2016-10-17 MED ORDER — LIDOCAINE HCL (CARDIAC) 20 MG/ML IV SOLN
INTRAVENOUS | Status: AC
  Filled 2016-10-17: qty 5

## 2016-10-17 MED ORDER — PROPOFOL 500 MG/50ML IV EMUL
INTRAVENOUS | Status: DC | PRN
  Administered 2016-10-17: 50 mg via INTRAVENOUS
  Administered 2016-10-17: 20 mg via INTRAVENOUS

## 2016-10-17 MED ORDER — OXYCODONE-ACETAMINOPHEN 5-325 MG PO TABS
1.0000 | ORAL_TABLET | Freq: Once | ORAL | Status: AC
  Administered 2016-10-17: 1 via ORAL

## 2016-10-17 MED ORDER — MIDAZOLAM HCL 2 MG/2ML IJ SOLN
INTRAMUSCULAR | Status: DC | PRN
Start: 2016-10-17 — End: 2016-10-17
  Administered 2016-10-17: 2 mg via INTRAVENOUS

## 2016-10-17 MED ORDER — MIDAZOLAM HCL 2 MG/2ML IJ SOLN
INTRAMUSCULAR | Status: AC
  Filled 2016-10-17: qty 2

## 2016-10-17 MED ORDER — PROMETHAZINE HCL 25 MG/ML IJ SOLN: 6.2500 mg | INTRAMUSCULAR | Status: DC | PRN

## 2016-10-17 MED ORDER — ONDANSETRON HCL 4 MG/2ML IJ SOLN
INTRAMUSCULAR | Status: DC | PRN
  Administered 2016-10-17: 4 mg via INTRAVENOUS

## 2016-10-17 MED ORDER — LACTATED RINGERS IV SOLN
INTRAVENOUS | Status: DC
  Administered 2016-10-17 (×2): via INTRAVENOUS

## 2016-10-17 MED ORDER — FENTANYL CITRATE (PF) 100 MCG/2ML IJ SOLN: 25.0000 ug | INTRAMUSCULAR | Status: DC | PRN

## 2016-10-17 MED ORDER — IBUPROFEN 600 MG PO TABS
600.0000 mg | ORAL_TABLET | Freq: Four times a day (QID) | ORAL | 1 refills | Status: AC | PRN
Start: 2016-10-17 — End: ?

## 2016-10-17 MED ORDER — LIDOCAINE HCL (CARDIAC) 20 MG/ML IV SOLN
INTRAVENOUS | Status: DC | PRN
  Administered 2016-10-17: 40 mg via INTRAVENOUS

## 2016-10-17 MED ORDER — SCOPOLAMINE 1 MG/3DAYS TD PT72
1.0000 | MEDICATED_PATCH | Freq: Once | TRANSDERMAL | Status: DC
  Administered 2016-10-17: 1.5 mg via TRANSDERMAL

## 2016-10-17 MED ORDER — LIDOCAINE HCL 2 % IJ SOLN
INTRAMUSCULAR | Status: AC
  Filled 2016-10-17: qty 20

## 2016-10-17 MED ORDER — PROPOFOL 10 MG/ML IV BOLUS
INTRAVENOUS | Status: AC
  Filled 2016-10-17: qty 20

## 2016-10-17 MED ORDER — FENTANYL CITRATE (PF) 100 MCG/2ML IJ SOLN
INTRAMUSCULAR | Status: DC | PRN
  Administered 2016-10-17 (×2): 50 ug via INTRAVENOUS

## 2016-10-17 MED ORDER — SCOPOLAMINE 1 MG/3DAYS TD PT72
MEDICATED_PATCH | TRANSDERMAL | Status: AC
  Administered 2016-10-17: 1.5 mg via TRANSDERMAL
  Filled 2016-10-17: qty 1

## 2016-10-17 MED ORDER — OXYCODONE-ACETAMINOPHEN 5-325 MG PO TABS
ORAL_TABLET | ORAL | Status: AC
  Filled 2016-10-17: qty 1

## 2016-10-17 MED ORDER — IBUPROFEN 600 MG PO TABS: 600.0000 mg | ORAL_TABLET | Freq: Four times a day (QID) | ORAL | 1 refills | Status: DC | PRN

## 2016-10-17 MED ORDER — FENTANYL CITRATE (PF) 100 MCG/2ML IJ SOLN
INTRAMUSCULAR | Status: AC
  Filled 2016-10-17: qty 2

## 2016-10-17 MED ORDER — DEXAMETHASONE SODIUM PHOSPHATE 10 MG/ML IJ SOLN
INTRAMUSCULAR | Status: DC | PRN
  Administered 2016-10-17: 4 mg via INTRAVENOUS

## 2016-10-17 SURGICAL SUPPLY — 20 items
CATH ROBINSON RED A/P 16FR (CATHETERS) ×3 IMPLANT
CLOTH BEACON ORANGE TIMEOUT ST (SAFETY) ×3 IMPLANT
DECANTER SPIKE VIAL GLASS SM (MISCELLANEOUS) ×3 IMPLANT
GLOVE BIO SURGEON STRL SZ7.5 (GLOVE) ×3 IMPLANT
GLOVE BIOGEL PI IND STRL 7.0 (GLOVE) ×1 IMPLANT
GLOVE BIOGEL PI IND STRL 7.5 (GLOVE) ×2 IMPLANT
GLOVE BIOGEL PI INDICATOR 7.0 (GLOVE) ×2
GLOVE BIOGEL PI INDICATOR 7.5 (GLOVE) ×4
GOWN STRL REUS W/TWL LRG LVL3 (GOWN DISPOSABLE) ×6 IMPLANT
KIT BERKELEY 1ST TRIMESTER 3/8 (MISCELLANEOUS) ×3 IMPLANT
NS IRRIG 1000ML POUR BTL (IV SOLUTION) ×3 IMPLANT
PACK VAGINAL MINOR WOMEN LF (CUSTOM PROCEDURE TRAY) ×3 IMPLANT
PAD OB MATERNITY 4.3X12.25 (PERSONAL CARE ITEMS) ×3 IMPLANT
PAD PREP 24X48 CUFFED NSTRL (MISCELLANEOUS) ×3 IMPLANT
SET BERKELEY SUCTION TUBING (SUCTIONS) ×3 IMPLANT
TOWEL OR 17X24 6PK STRL BLUE (TOWEL DISPOSABLE) ×6 IMPLANT
VACURETTE 10 RIGID CVD (CANNULA) IMPLANT
VACURETTE 7MM CVD STRL WRAP (CANNULA) IMPLANT
VACURETTE 8 RIGID CVD (CANNULA) ×2 IMPLANT
VACURETTE 9 RIGID CVD (CANNULA) IMPLANT

## 2016-10-17 NOTE — Anesthesia Procedure Notes (Signed)
Procedure Name: MAC Date/Time: 10/17/2016 11:12 AM Performed by: Kensington Rios, Sheron Nightingale Pre-anesthesia Checklist: Patient identified, Timeout performed, Emergency Drugs available, Suction available and Patient being monitored Patient Re-evaluated:Patient Re-evaluated prior to inductionOxygen Delivery Method: Circle system utilized Intubation Type: IV induction

## 2016-10-17 NOTE — Anesthesia Preprocedure Evaluation (Signed)
Anesthesia Evaluation  Patient identified by MRN, date of birth, ID band Patient awake    Reviewed: Allergy & Precautions, NPO status , Patient's Chart, lab work & pertinent test results  Airway Mallampati: I  TM Distance: >3 FB Neck ROM: Full    Dental  (+) Teeth Intact, Dental Advisory Given   Pulmonary asthma , former smoker,    breath sounds clear to auscultation       Cardiovascular negative cardio ROS   Rhythm:Regular Rate:Normal     Neuro/Psych  Headaches,    GI/Hepatic Neg liver ROS, GERD  ,  Endo/Other  negative endocrine ROS  Renal/GU negative Renal ROS     Musculoskeletal   Abdominal   Peds  Hematology negative hematology ROS (+)   Anesthesia Other Findings   Reproductive/Obstetrics                             Lab Results  Component Value Date   WBC 11.1 (H) 10/17/2016   HGB 13.9 10/17/2016   HCT 39.3 10/17/2016   MCV 86.6 10/17/2016   PLT 278 10/17/2016   Lab Results  Component Value Date   CREATININE 0.93 09/02/2013   BUN 9 09/02/2013   NA 138 09/02/2013   K 3.8 09/02/2013   CL 102 09/02/2013   CO2 22 09/02/2013    Anesthesia Physical  Anesthesia Plan  ASA: II  Anesthesia Plan: MAC   Post-op Pain Management:    Induction: Intravenous  Airway Management Planned: Natural Airway and Simple Face Mask  Additional Equipment:   Intra-op Plan:   Post-operative Plan:   Informed Consent: I have reviewed the patients History and Physical, chart, labs and discussed the procedure including the risks, benefits and alternatives for the proposed anesthesia with the patient or authorized representative who has indicated his/her understanding and acceptance.   Dental advisory given  Plan Discussed with: CRNA, Anesthesiologist and Surgeon  Anesthesia Plan Comments:         Anesthesia Quick Evaluation

## 2016-10-17 NOTE — Op Note (Signed)
Preop Diagnosis: Abnormal Pregnancy   Postop Diagnosis: Abnormal Pregnancy   Procedure: DILATATION AND EVACUATION/SUCTION D&C  Anesthesia: per Anesthesia   Attending: Osborn CohoAngela Patina Spanier, MD   Assistant: N/a  Findings: Large amount of POCs  Pathology: POCs  Fluids: 800 cc  UOP: 50 cc  EBL: 75 cc  Complications: None  Procedure: The patient was taken to the operating room after the risks benefits and alternatives were discussed with the patient, the patient verbalized understanding and consent signed and witnessed.  The patient was placed under anesthesia, prepped and draped in the normal sterile fashion and a time out was performed.  A bivalve speculum was placed in the patient's vagina and the anterior lip of the cervix grasped with a single-tooth tenaculum.  The uterus was sounded to 9.5 cm and a size 8 suction curette was used. Suction curettage was performed until minimal tissue returned. Sharp curettage was performed until a gritty texture was noted. Suction curettage was performed once again to remove any remaining debris. All instruments were removed. The count was correct. The patient was transferred to the recovery room in good condition.

## 2016-10-17 NOTE — Transfer of Care (Signed)
Immediate Anesthesia Transfer of Care Note  Patient: Elizabeth Adams  Procedure(s) Performed: Procedure(s): DILATATION AND EVACUATION (N/A)  Patient Location: PACU  Anesthesia Type:MAC  Level of Consciousness: awake, alert  and oriented  Airway & Oxygen Therapy: Patient Spontanous Breathing  Post-op Assessment: Report given to RN and Post -op Vital signs reviewed and stable  Post vital signs: Reviewed and stable  Last Vitals:  Vitals:   10/17/16 0920  BP: 113/60  Pulse: 81  Resp: 16  Temp: 36.6 C    Last Pain:  Vitals:   10/17/16 0920  TempSrc: Oral      Patients Stated Pain Goal: 2 (87/21/58 7276)  Complications: No apparent anesthesia complications

## 2016-10-17 NOTE — Discharge Instructions (Signed)
Dilation and Curettage or Vacuum Curettage, Care After  These instructions give you information about caring for yourself after your procedure. Your doctor may also give you more specific instructions. Call your doctor if you have any problems or questions after your procedure.  Follow these instructions at home:  Activity   · Do not drive or use heavy machinery while taking prescription pain medicine.  · For 24 hours after your procedure, avoid driving.  · Take short walks often, followed by rest periods. Ask your doctor what activities are safe for you. After one or two days, you may be able to return to your normal activities.  · Do not lift anything that is heavier than 10 lb (4.5 kg) until your doctor approves.  · For at least 2 weeks, or as long as told by your doctor:  ? Do not douche.  ? Do not use tampons.  ? Do not have sex.  General instructions   · Take over-the-counter and prescription medicines only as told by your doctor. This is very important if you take blood thinning medicine.  · Do not take baths, swim, or use a hot tub until your doctor approves. Take showers instead of baths.  · Wear compression stockings as told by your doctor.  · It is up to you to get the results of your procedure. Ask your doctor when your results will be ready.  · Keep all follow-up visits as told by your doctor. This is important.  Contact a doctor if:  · You have very bad cramps that get worse or do not get better with medicine.  · You have very bad pain in your belly (abdomen).  · You cannot drink fluids without throwing up (vomiting).  · You get pain in a different part of the area between your belly and thighs (pelvis).  · You have bad-smelling discharge from your vagina.  · You have a rash.  Get help right away if:  · You are bleeding a lot from your vagina. A lot of bleeding means soaking more than one sanitary pad in an hour, for 2 hours in a row.  · You have clumps of blood (blood clots) coming from your  vagina.  · You have a fever or chills.  · Your belly feels very tender or hard.  · You have chest pain.  · You have trouble breathing.  · You cough up blood.  · You feel dizzy.  · You feel light-headed.  · You pass out (faint).  · You have pain in your neck or shoulder area.  Summary  · Take short walks often, followed by rest periods. Ask your doctor what activities are safe for you. After one or two days, you may be able to return to your normal activities.  · Do not lift anything that is heavier than 10 lb (4.5 kg) until your doctor approves.  · Do not take baths, swim, or use a hot tub until your doctor approves. Take showers instead of baths.  · Contact your doctor if you have any symptoms of infection, like bad-smelling discharge from your vagina.  This information is not intended to replace advice given to you by your health care provider. Make sure you discuss any questions you have with your health care provider.  Document Released: 05/15/2008 Document Revised: 04/23/2016 Document Reviewed: 04/23/2016  [employer]vier Interactive Patient Education © 2017 [employer]

## 2016-10-17 NOTE — Anesthesia Postprocedure Evaluation (Signed)
Anesthesia Post Note  Patient: Elizabeth Adams  Procedure(s) Performed: Procedure(s) (LRB): DILATATION AND EVACUATION (N/A)  Patient location during evaluation: PACU Anesthesia Type: MAC Level of consciousness: awake and alert Pain management: pain level controlled Vital Signs Assessment: post-procedure vital signs reviewed and stable Respiratory status: spontaneous breathing, nonlabored ventilation, respiratory function stable and patient connected to nasal cannula oxygen Cardiovascular status: stable and blood pressure returned to baseline Anesthetic complications: no        Last Vitals:  Vitals:   10/17/16 1145 10/17/16 1225  BP: (!) 111/54   Pulse: 72 81  Resp: 16 20  Temp:  36.9 C    Last Pain:  Vitals:   10/17/16 1225  TempSrc:   PainSc: 3    Pain Goal: Patients Stated Pain Goal: 2 (10/17/16 1138)               Tiajuana Amass

## 2016-10-17 NOTE — Interval H&P Note (Signed)
History and Physical Interval Note:  10/17/2016 10:50 AM  Elizabeth Adams  has presented today for surgery, with the diagnosis of Abnormal Pregnancy  The various methods of treatment have been discussed with the patient and family. After consideration of risks, benefits and other options for treatment, the patient has consented to  Procedure(s): DILATATION AND EVACUATION (N/A) as a surgical intervention .  The patient's history has been reviewed, patient examined, no change in status, stable for surgery.  I have reviewed the patient's chart and labs.  Questions were answered to the patient's satisfaction.     Purcell NailsOBERTS,Torell Minder Y

## 2016-10-18 NOTE — Addendum Note (Signed)
Addendum  created 10/18/16 [id number] by Algis GreenhouseLinda A Jevon Littlepage, CRNA   Charge Capture section accepted

## 2016-10-19 ENCOUNTER — Encounter (HOSPITAL_COMMUNITY): Payer: Self-pay | Admitting: Obstetrics and Gynecology

## 2017-04-29 IMAGING — US US OB TRANSVAGINAL
1 series · 15 of 28 positions shown · non-contrast
Comparison: None.

CLINICAL DATA: First trimester pregnancy with inconclusive fetal
viability. Gestational age by LMP of 8 weeks 3 days.

EXAM:
OBSTETRIC <14 WK US AND TRANSVAGINAL OB US
TECHNIQUE: Both transabdominal and transvaginal ultrasound examinations were
performed for complete evaluation of the gestation as well as the
maternal uterus, adnexal regions, and pelvic cul-de-sac.
Transvaginal technique was performed to assess early pregnancy.

[Series 1: us ob transvaginal · 68 acquisitions, 15 frames shown]
[im 1/68]
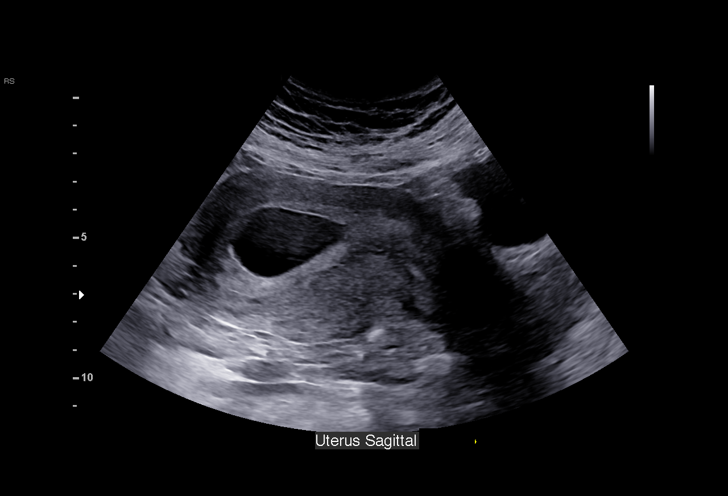
[im 5/68]
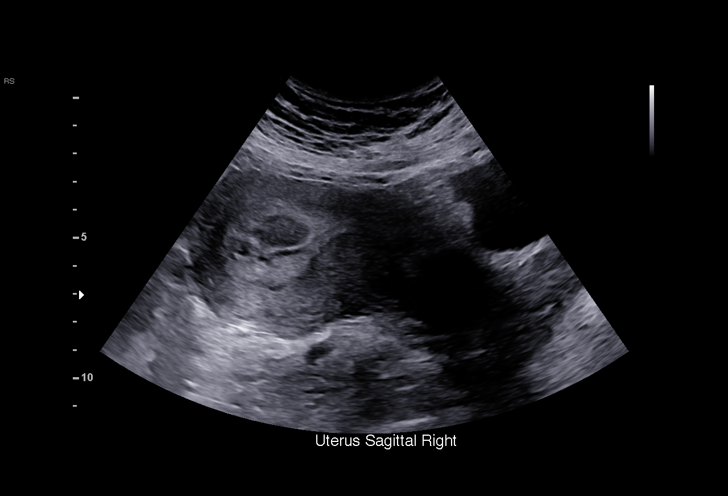
[im 10/68]
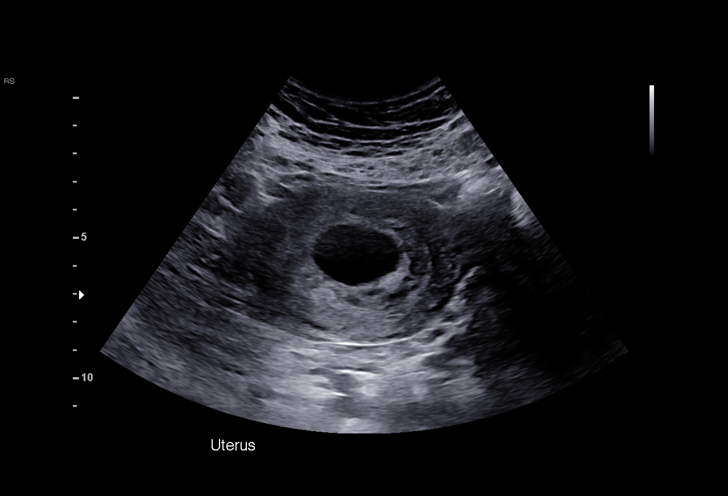
[im 15/68]
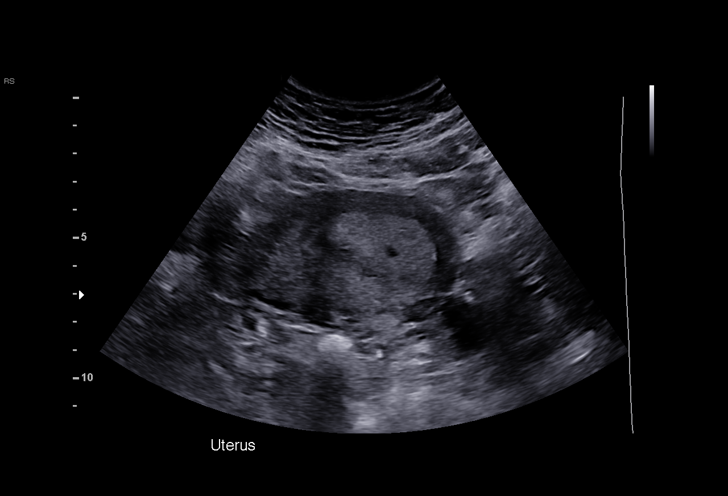
[im 20/68]
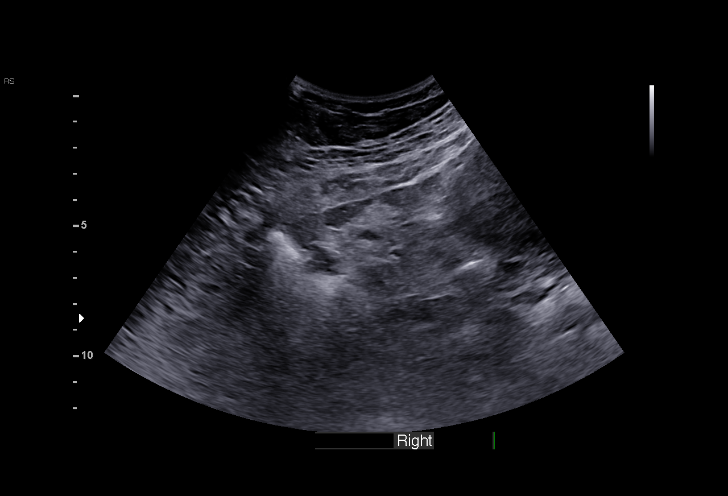
[im 25/68]
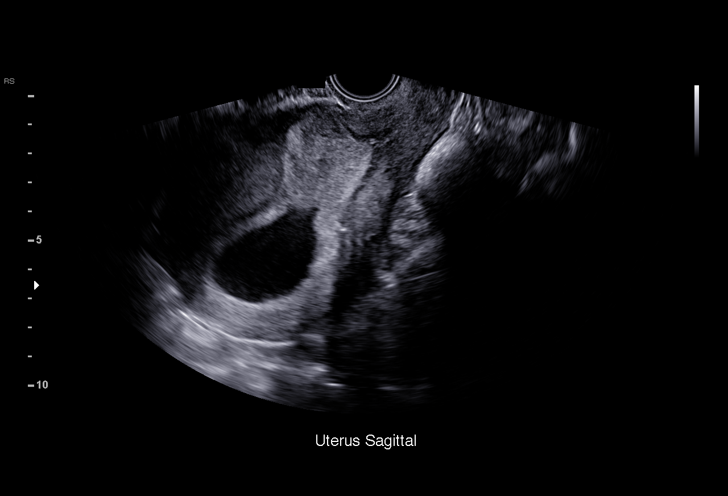
[im 30/68]
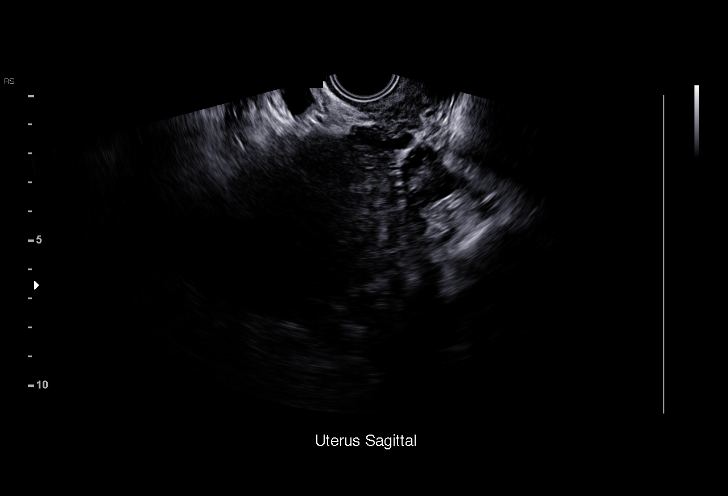
[im 35/68]
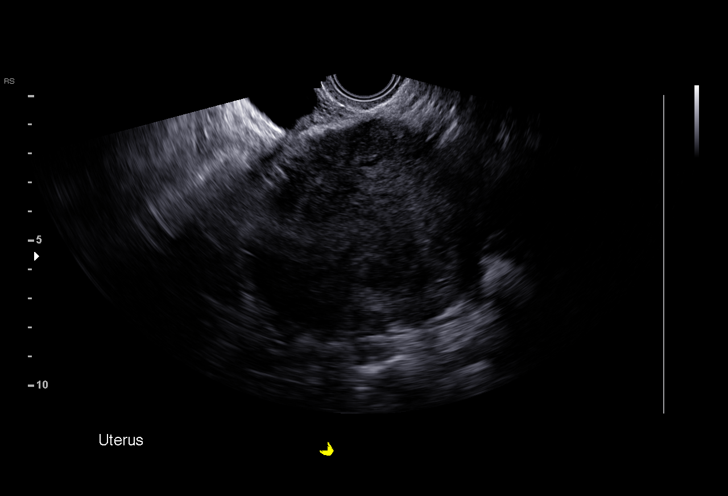
[im 38/68]
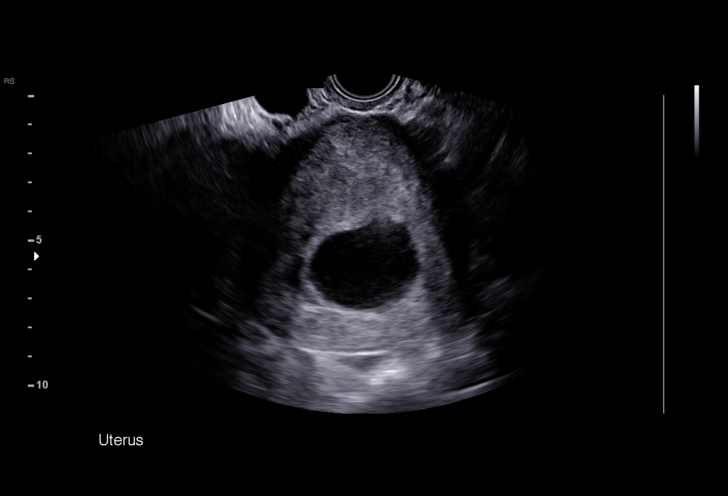
[im 43/68]
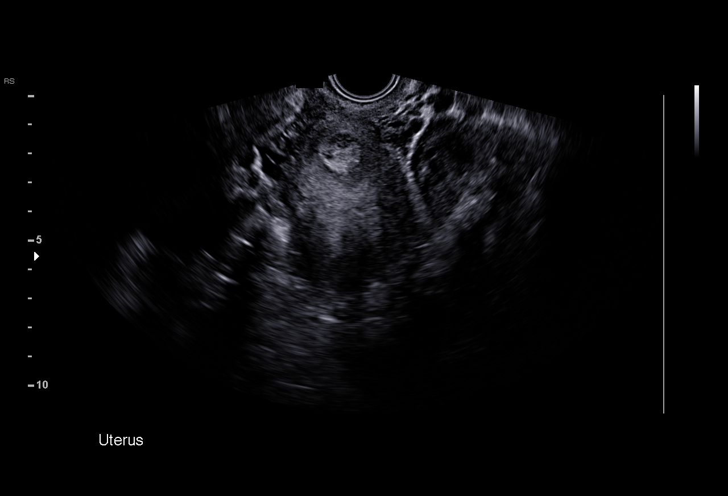
[im 48/68]
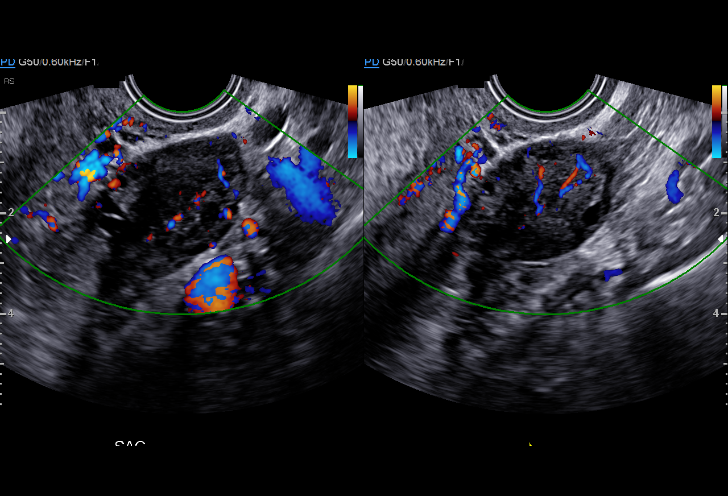
[im 53/68]
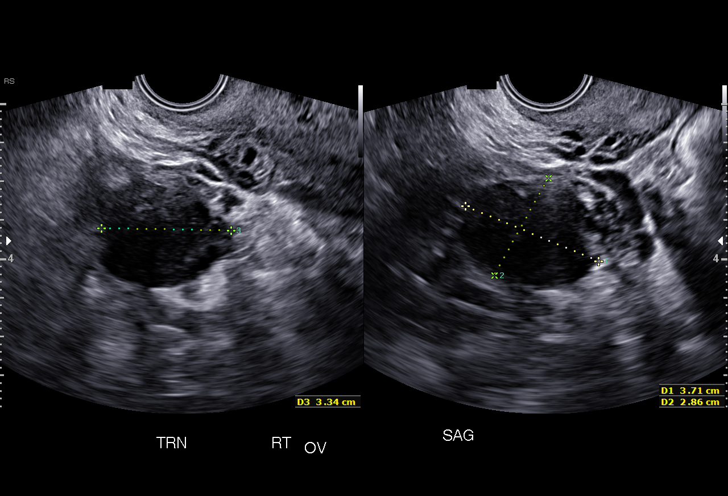
[im 58/68]
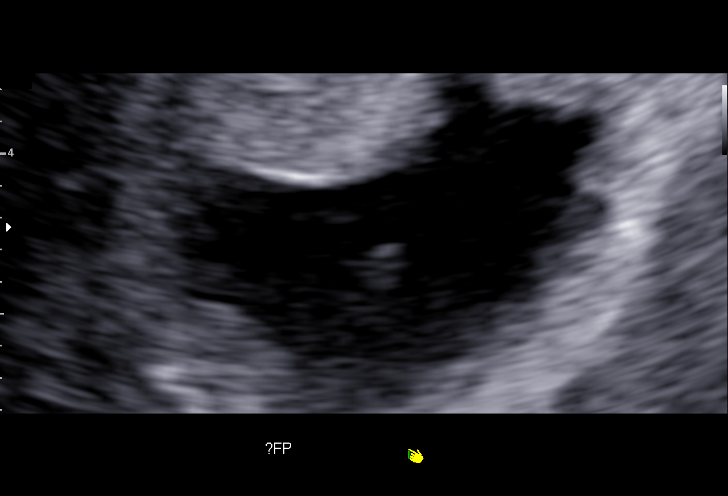
[im 63/68]
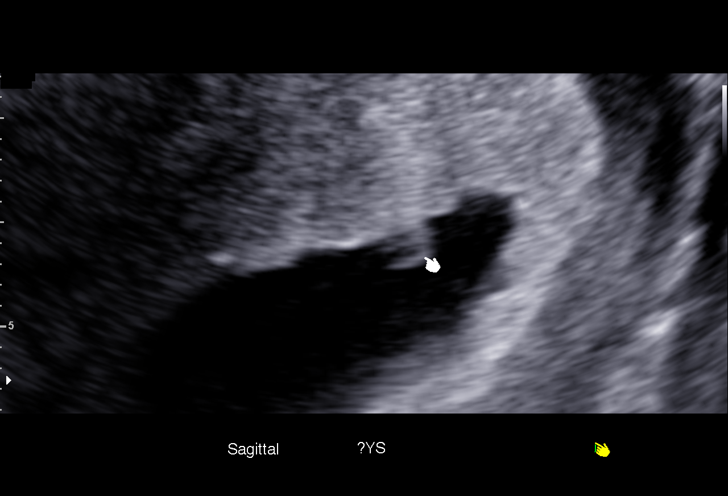
[im 68/68]
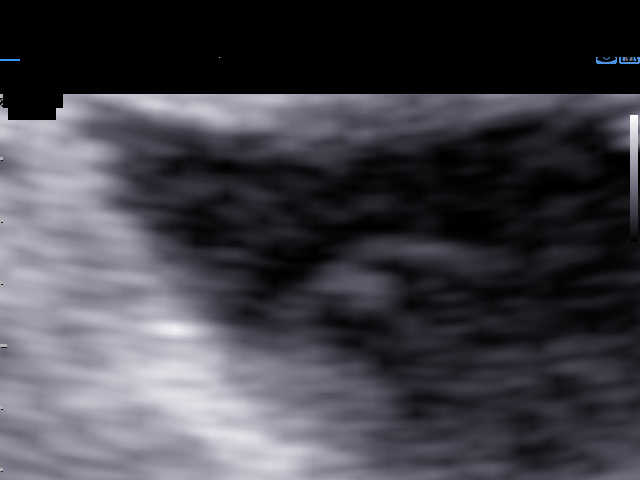

[15 of 28 positions shown; findings below may reference images not displayed]

FINDINGS: Intrauterine gestational sac: Single, irregular in shape

Yolk sac:  Probable abnormal yolk sac

Embryo:  Not Visualized.

MSD: 33  mm   8 w   3  d

Subchorionic hemorrhage:  None visualized.

Maternal uterus/adnexae: Both ovaries are normal appearance. No mass
or abnormal free fluid identified.
IMPRESSION: Findings meet definitive criteria for failed pregnancy. This follows
SRU consensus guidelines: Diagnostic Criteria for Nonviable
Pregnancy Early in the First Trimester. N Engl J Med

## 2019-12-01 ENCOUNTER — Other Ambulatory Visit: Payer: Self-pay | Admitting: Obstetrics and Gynecology
